# Patient Record
Sex: Female | Born: 1935 | Race: White | Hispanic: No | Marital: Married | Smoking: Never smoker
Health system: Southern US, Community
[De-identification: ages and names within clinical notes are randomized; demographics above are authoritative.]

## PROBLEM LIST (undated history)

## (undated) DIAGNOSIS — C44300 Unspecified malignant neoplasm of skin of unspecified part of face: Secondary | ICD-10-CM

## (undated) DIAGNOSIS — E039 Hypothyroidism, unspecified: Secondary | ICD-10-CM

## (undated) DIAGNOSIS — E0590 Thyrotoxicosis, unspecified without thyrotoxic crisis or storm: Secondary | ICD-10-CM

## (undated) DIAGNOSIS — N200 Calculus of kidney: Secondary | ICD-10-CM

## (undated) DIAGNOSIS — M51369 Other intervertebral disc degeneration, lumbar region without mention of lumbar back pain or lower extremity pain: Secondary | ICD-10-CM

## (undated) DIAGNOSIS — E1129 Type 2 diabetes mellitus with other diabetic kidney complication: Secondary | ICD-10-CM

## (undated) DIAGNOSIS — Z973 Presence of spectacles and contact lenses: Secondary | ICD-10-CM

## (undated) DIAGNOSIS — J309 Allergic rhinitis, unspecified: Secondary | ICD-10-CM

## (undated) DIAGNOSIS — Z8639 Personal history of other endocrine, nutritional and metabolic disease: Secondary | ICD-10-CM

## (undated) DIAGNOSIS — G20A1 Parkinson's disease without dyskinesia, without mention of fluctuations: Secondary | ICD-10-CM

## (undated) DIAGNOSIS — N049 Nephrotic syndrome with unspecified morphologic changes: Secondary | ICD-10-CM

## (undated) DIAGNOSIS — M1990 Unspecified osteoarthritis, unspecified site: Secondary | ICD-10-CM

## (undated) DIAGNOSIS — I10 Essential (primary) hypertension: Secondary | ICD-10-CM

## (undated) DIAGNOSIS — I272 Pulmonary hypertension, unspecified: Secondary | ICD-10-CM

## (undated) DIAGNOSIS — I509 Heart failure, unspecified: Secondary | ICD-10-CM

## (undated) DIAGNOSIS — I5189 Other ill-defined heart diseases: Secondary | ICD-10-CM

## (undated) DIAGNOSIS — N184 Chronic kidney disease, stage 4 (severe): Secondary | ICD-10-CM

## (undated) DIAGNOSIS — J45909 Unspecified asthma, uncomplicated: Secondary | ICD-10-CM

## (undated) DIAGNOSIS — E119 Type 2 diabetes mellitus without complications: Secondary | ICD-10-CM

## (undated) DIAGNOSIS — E785 Hyperlipidemia, unspecified: Secondary | ICD-10-CM

## (undated) DIAGNOSIS — Z9981 Dependence on supplemental oxygen: Secondary | ICD-10-CM

## (undated) DIAGNOSIS — M5136 Other intervertebral disc degeneration, lumbar region: Secondary | ICD-10-CM

## (undated) DIAGNOSIS — K219 Gastro-esophageal reflux disease without esophagitis: Secondary | ICD-10-CM

## (undated) DIAGNOSIS — G20 Parkinson's disease: Secondary | ICD-10-CM

## (undated) DIAGNOSIS — H1010 Acute atopic conjunctivitis, unspecified eye: Secondary | ICD-10-CM

## (undated) HISTORY — DX: Allergic rhinitis, unspecified: J30.9

## (undated) HISTORY — DX: Essential (primary) hypertension: I10

## (undated) HISTORY — PX: SKIN CANCER EXCISION: SHX779

## (undated) HISTORY — DX: Other intervertebral disc degeneration, lumbar region without mention of lumbar back pain or lower extremity pain: M51.369

## (undated) HISTORY — DX: Nephrotic syndrome with unspecified morphologic changes: N04.9

## (undated) HISTORY — DX: Gastro-esophageal reflux disease without esophagitis: K21.9

## (undated) HISTORY — PX: CATARACT EXTRACTION W/ INTRAOCULAR LENS  IMPLANT, BILATERAL: SHX1307

## (undated) HISTORY — DX: Hyperlipidemia, unspecified: E78.5

## (undated) HISTORY — DX: Parkinson's disease: G20

## (undated) HISTORY — PX: BREAST BIOPSY: SHX20

## (undated) HISTORY — DX: Parkinson's disease without dyskinesia, without mention of fluctuations: G20.A1

## (undated) HISTORY — DX: Other ill-defined heart diseases: I51.89

## (undated) HISTORY — DX: Type 2 diabetes mellitus without complications: E11.9

## (undated) HISTORY — PX: ABDOMINAL HYSTERECTOMY: SHX81

## (undated) HISTORY — PX: LUMBAR LAMINECTOMY: SHX95

## (undated) HISTORY — DX: Acute atopic conjunctivitis, unspecified eye: H10.10

## (undated) HISTORY — PX: BACK SURGERY: SHX140

## (undated) HISTORY — PX: LAPAROSCOPIC CHOLECYSTECTOMY: SUR755

## (undated) HISTORY — DX: Unspecified asthma, uncomplicated: J45.909

## (undated) HISTORY — DX: Type 2 diabetes mellitus with other diabetic kidney complication: E11.29

## (undated) HISTORY — PX: VESICOVAGINAL FISTULA CLOSURE W/ TAH: SUR271

## (undated) HISTORY — DX: Other intervertebral disc degeneration, lumbar region: M51.36

## (undated) HISTORY — DX: Pulmonary hypertension, unspecified: I27.20

## (undated) HISTORY — DX: Personal history of other endocrine, nutritional and metabolic disease: Z86.39

## (undated) HISTORY — PX: APPENDECTOMY: SHX54

---
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
## 2012-07-12 DIAGNOSIS — M5414 Radiculopathy, thoracic region: Secondary | ICD-10-CM | POA: Insufficient documentation

## 2013-01-09 ENCOUNTER — Other Ambulatory Visit (HOSPITAL_COMMUNITY): Payer: Self-pay | Admitting: *Deleted

## 2013-01-12 ENCOUNTER — Encounter (HOSPITAL_COMMUNITY): Payer: Self-pay

## 2013-04-20 DIAGNOSIS — M5137 Other intervertebral disc degeneration, lumbosacral region: Secondary | ICD-10-CM | POA: Insufficient documentation

## 2013-04-20 DIAGNOSIS — M961 Postlaminectomy syndrome, not elsewhere classified: Secondary | ICD-10-CM | POA: Insufficient documentation

## 2013-04-20 DIAGNOSIS — M533 Sacrococcygeal disorders, not elsewhere classified: Secondary | ICD-10-CM | POA: Insufficient documentation

## 2013-06-21 DIAGNOSIS — G20C Parkinsonism, unspecified: Secondary | ICD-10-CM | POA: Insufficient documentation

## 2013-06-21 DIAGNOSIS — G64 Other disorders of peripheral nervous system: Secondary | ICD-10-CM | POA: Insufficient documentation

## 2013-06-21 DIAGNOSIS — E1142 Type 2 diabetes mellitus with diabetic polyneuropathy: Secondary | ICD-10-CM | POA: Insufficient documentation

## 2013-06-21 DIAGNOSIS — I679 Cerebrovascular disease, unspecified: Secondary | ICD-10-CM | POA: Insufficient documentation

## 2013-06-21 DIAGNOSIS — G20 Parkinson's disease: Secondary | ICD-10-CM | POA: Insufficient documentation

## 2013-06-21 DIAGNOSIS — M48061 Spinal stenosis, lumbar region without neurogenic claudication: Secondary | ICD-10-CM | POA: Insufficient documentation

## 2013-08-31 ENCOUNTER — Encounter: Payer: Self-pay | Admitting: Critical Care Medicine

## 2013-09-01 ENCOUNTER — Encounter: Payer: Self-pay | Admitting: Critical Care Medicine

## 2013-09-01 ENCOUNTER — Ambulatory Visit (INDEPENDENT_AMBULATORY_CARE_PROVIDER_SITE_OTHER): Payer: Self-pay | Admitting: Critical Care Medicine

## 2013-09-01 VITALS — BP 160/70 | HR 83 | Temp 97.1°F | Ht 60.0 in | Wt 196.0 lb

## 2013-09-01 DIAGNOSIS — J309 Allergic rhinitis, unspecified: Secondary | ICD-10-CM

## 2013-09-01 DIAGNOSIS — N189 Chronic kidney disease, unspecified: Secondary | ICD-10-CM | POA: Insufficient documentation

## 2013-09-01 DIAGNOSIS — J45901 Unspecified asthma with (acute) exacerbation: Secondary | ICD-10-CM

## 2013-09-01 DIAGNOSIS — R0600 Dyspnea, unspecified: Secondary | ICD-10-CM

## 2013-09-01 DIAGNOSIS — N049 Nephrotic syndrome with unspecified morphologic changes: Secondary | ICD-10-CM | POA: Insufficient documentation

## 2013-09-01 DIAGNOSIS — J4540 Moderate persistent asthma, uncomplicated: Secondary | ICD-10-CM | POA: Insufficient documentation

## 2013-09-01 DIAGNOSIS — J4541 Moderate persistent asthma with (acute) exacerbation: Secondary | ICD-10-CM

## 2013-09-01 DIAGNOSIS — K219 Gastro-esophageal reflux disease without esophagitis: Secondary | ICD-10-CM | POA: Insufficient documentation

## 2013-09-01 DIAGNOSIS — E785 Hyperlipidemia, unspecified: Secondary | ICD-10-CM | POA: Insufficient documentation

## 2013-09-01 DIAGNOSIS — E1129 Type 2 diabetes mellitus with other diabetic kidney complication: Secondary | ICD-10-CM | POA: Insufficient documentation

## 2013-09-01 DIAGNOSIS — H1010 Acute atopic conjunctivitis, unspecified eye: Secondary | ICD-10-CM | POA: Insufficient documentation

## 2013-09-01 DIAGNOSIS — I5189 Other ill-defined heart diseases: Secondary | ICD-10-CM | POA: Insufficient documentation

## 2013-09-01 DIAGNOSIS — I10 Essential (primary) hypertension: Secondary | ICD-10-CM | POA: Insufficient documentation

## 2013-09-01 DIAGNOSIS — G20 Parkinson's disease: Secondary | ICD-10-CM | POA: Insufficient documentation

## 2013-09-01 DIAGNOSIS — E1142 Type 2 diabetes mellitus with diabetic polyneuropathy: Secondary | ICD-10-CM | POA: Insufficient documentation

## 2013-09-01 MED ORDER — FLUTICASONE FUROATE-VILANTEROL 200-25 MCG/INH IN AEPB: 1.0000 | INHALATION_SPRAY | Freq: Every day | RESPIRATORY_TRACT | Status: DC

## 2013-09-01 NOTE — Patient Instructions (Signed)
We will schedule you to have a 2D echo at [facility] A breathing test and six minute walk test will be obtained at [facility] We will test your oxygen levels at bedtime  Stop Pulmicort Start Breo 200 1 puff daily Follow up with Dr. Joya Gaskins in 1 month in [place]

## 2013-09-01 NOTE — Assessment & Plan Note (Addendum)
Moderate persistent asthma with significant lower airway inflammation Plan Stop Pulmicort Start Breo 200 1 puff daily Obtain pulmonary function studies Obtain echogram Obtain 6 minute walk

## 2013-09-01 NOTE — Progress Notes (Signed)
Subjective:    Patient ID: Katelyn Dunlap, female    DOB: [birth year], 80 y.o.   MRN: [id number]  HPI Comments: Cough and wheeze for 35months, green mucus. Life long never smoker .  Dx asthma: 41yrs Hx of ? pulm HTN  No echo??  Shortness of Breath This is a chronic problem. The current episode started more than 1 year ago. The problem occurs constantly (worse with any activity). The problem has been rapidly worsening. Associated symptoms include chest pain, leg swelling, orthopnea, PND, a sore throat, sputum production and wheezing. Pertinent negatives include no abdominal pain, headaches, hemoptysis, rhinorrhea or vomiting. The symptoms are aggravated by weather changes, odors, fumes, smoke, lying flat and any activity. Associated symptoms comments: No pndrip occ gerd. She has tried beta agonist inhalers for the symptoms. The treatment provided moderate relief. Her past medical history is significant for allergies and asthma. There is no history of bronchiolitis, CAD, chronic lung disease, COPD, DVT, a heart failure, PE, pneumonia or a recent surgery. (Had allergy injections in the past. )   Past Medical History  Diagnosis Date  . Asthma   . Allergic rhinoconjunctivitis   . Pulmonary hypertension   . Diastolic dysfunction   . GERD (gastroesophageal reflux disease)   . Hypertension   . Diabetes   . Hyperlipemia   . Degenerative lumbar disc   . Parkinson's disease   . H/O Graves' disease   . Chronic kidney disease   . Nephrotic syndrome   . DM (diabetes mellitus), type 2 with renal complications      Family History  Problem Relation Age of Onset  . Heart disease Father      History   Social History  . Marital Status: N/A    Spouse Name: N/A    Number of Children: N/A  . Years of Education: N/A   Occupational History  . retired     [employer]   Social History Main Topics  . Smoking status: Never Smoker   . Smokeless tobacco: Never Used  . Alcohol Use: No  .  Drug Use: No  . Sexual Activity: Not on file   Other Topics Concern  . Not on file   Social History Narrative  . No narrative on file     Allergies  Allergen Reactions  . Ace Inhibitors Cough    Cough  . Amlodipine     swelling  . Nitrofurantoin     rash  . Penicillins     rash  . Pentazocine     [place] - insomnia  . Pentazocine-Naloxone     Other reaction(s): ABDOMINAL PAIN  . Pentobarbital   . Streptomycin     Rash, insomnia  . Sulfa Antibiotics     rash     Outpatient Prescriptions Prior to Visit  Medication Sig Dispense Refill  . albuterol (PROVENTIL HFA;VENTOLIN HFA) 108 (90 BASE) MCG/ACT inhaler Inhale 1-2 puffs into the lungs every 6 (six) hours as needed for wheezing or shortness of breath.      . budesonide (PULMICORT) 180 MCG/ACT inhaler Inhale 3 puffs into the lungs 2 (two) times daily.      . cetirizine (ZYRTEC) 10 MG tablet Take 10 mg by mouth daily.      . Furosemide (LASIX PO) Take 60 mg by mouth 2 (two) times daily.      Marland Kitchen labetalol (NORMODYNE) 300 MG tablet Take 300 mg by mouth 2 (two) times daily.      Marland Kitchen omeprazole (PRILOSEC)  20 MG capsule Take 20 mg by mouth daily.      . hydrALAZINE (APRESOLINE) 100 MG tablet Take 100 mg by mouth 3 (three) times daily.      . mometasone (NASONEX) 50 MCG/ACT nasal spray Place 1 spray into the nose daily.       No facility-administered medications prior to visit.       Review of Systems  HENT: Positive for sore throat. Negative for rhinorrhea, trouble swallowing and voice change.   Respiratory: Positive for cough, sputum production, chest tightness, shortness of breath and wheezing. Negative for hemoptysis.   Cardiovascular: Positive for chest pain, orthopnea, leg swelling and PND.  Gastrointestinal: Negative for vomiting and abdominal pain.  Neurological: Negative for headaches.       Objective:   Physical Exam Filed Vitals:   09/01/13 1209  BP: 160/70  Pulse: 83  Temp: 97.1 F (36.2 C)  TempSrc:  Oral  Height: 5' (1.524 m)  Weight: 196 lb (88.905 kg)  SpO2: 94%    Gen: Pleasant, well-nourished, in no distress,  normal affect  ENT: No lesions,  mouth clear,  oropharynx clear, no postnasal drip  Neck: No JVD, no TMG, no carotid bruits  Lungs: No use of accessory muscles, no dullness to percussion, distant breath sound  Cardiovascular: RRR, heart sounds normal, no murmur or gallops, no peripheral edema  Abdomen: soft and NT, no HSM,  BS normal  Musculoskeletal: No deformities, no cyanosis or clubbing  Neuro: alert, non focal  Skin: Warm, no lesions or rashes  No results found.   Chest x-ray was reviewed     Assessment & Plan:   Asthma, moderate persistent Moderate persistent asthma with significant lower airway inflammation Plan Stop Pulmicort Start Breo 200 1 puff daily Obtain pulmonary function studies Obtain echogram Obtain 6 minute walk    Updated Medication List Outpatient Encounter Prescriptions as of 09/01/2013  Medication Sig  . acetaminophen (TYLENOL) 650 MG CR tablet Take 650 mg by mouth every 8 (eight) hours as needed for pain.  Marland Kitchen albuterol (PROVENTIL HFA;VENTOLIN HFA) 108 (90 BASE) MCG/ACT inhaler Inhale 1-2 puffs into the lungs every 6 (six) hours as needed for wheezing or shortness of breath.  Marland Kitchen aspirin 81 MG tablet Take 81 mg by mouth daily.  Marland Kitchen atorvastatin (LIPITOR) 80 MG tablet Take 80 mg by mouth daily.  . carbidopa-levodopa (SINEMET IR) 25-100 MG per tablet Take 1 tablet by mouth 3 (three) times daily.  . cetirizine (ZYRTEC) 10 MG tablet Take 10 mg by mouth daily.  . cloNIDine (CATAPRES) 0.1 MG tablet Take 0.1 mg by mouth at bedtime.  . EPOETIN ALFA IJ Inject as directed once a week.  Marland Kitchen Fe-Succ-C-Thre-B12-Des Stomach (MULTIGEN PO) Take by mouth daily.  . ferrous sulfate 325 (65 FE) MG tablet Take 325 mg by mouth every other day.  . folic acid (FOLVITE) 818 MCG tablet Take 400 mcg by mouth daily.  . Furosemide (LASIX PO) Take 60 mg by  mouth 2 (two) times daily.  Marland Kitchen gabapentin (NEURONTIN) 300 MG capsule Take 300 mg by mouth 3 (three) times daily.  . Glucosamine-Chondroitin 250-200 MG CAPS Take by mouth 2 (two) times daily.  . insulin aspart (NOVOLOG FLEXPEN) 100 UNIT/ML FlexPen Inject 22 Units into the skin 3 (three) times daily with meals.  . Insulin Detemir (LEVEMIR FLEXPEN) 100 UNIT/ML Pen Inject 45 Units into the skin at bedtime.  . isosorbide mononitrate (IMDUR) 120 MG 24 hr tablet Take 120 mg by mouth daily.  Marland Kitchen  labetalol (NORMODYNE) 300 MG tablet Take 300 mg by mouth 2 (two) times daily.  Marland Kitchen levothyroxine (SYNTHROID, LEVOTHROID) 200 MCG tablet Take 200 mcg by mouth daily before breakfast.  . Methylcellulose, Laxative, (FIBER THERAPY PO) Take by mouth 2 (two) times daily.  . metolazone (ZAROXOLYN) 5 MG tablet Take 5 mg by mouth every other day.  . Multiple Vitamin (MULTIVITAMIN) tablet Take 1 tablet by mouth daily.  . multivitamin-lutein (OCUVITE-LUTEIN) CAPS capsule Take 1 capsule by mouth daily.  Marland Kitchen omeprazole (PRILOSEC) 20 MG capsule Take 20 mg by mouth daily.  Marland Kitchen oxycodone (OXY-IR) 5 MG capsule Take 2.5-5 mg by mouth 2 (two) times daily as needed.  Marland Kitchen rOPINIRole (REQUIP) 2 MG tablet Take 2 mg by mouth 3 (three) times daily.  . valsartan (DIOVAN) 160 MG tablet Take 160 mg by mouth daily.  . [DISCONTINUED] budesonide (PULMICORT) 180 MCG/ACT inhaler Inhale 3 puffs into the lungs 2 (two) times daily.  . cyclobenzaprine (FLEXERIL) 5 MG tablet Take 10 mg by mouth 2 (two) times daily.  . Fluticasone Furoate-Vilanterol (BREO ELLIPTA) 200-25 MCG/INH AEPB Inhale 1 puff into the lungs daily.  . hydrALAZINE (APRESOLINE) 100 MG tablet Take 100 mg by mouth 3 (three) times daily.  . mometasone (NASONEX) 50 MCG/ACT nasal spray Place 1 spray into the nose daily.  . [DISCONTINUED] levothyroxine (SYNTHROID, LEVOTHROID) 175 MCG tablet Take 175 mcg by mouth daily before breakfast.

## 2013-09-04 LAB — PULMONARY FUNCTION TEST

## 2013-09-07 ENCOUNTER — Telehealth: Payer: Self-pay | Admitting: Critical Care Medicine

## 2013-09-07 DIAGNOSIS — J4541 Moderate persistent asthma with (acute) exacerbation: Secondary | ICD-10-CM

## 2013-09-07 NOTE — Telephone Encounter (Signed)
ONO on RA positive desaturation Pt needs oxygen 2L qhs  Use [facility]

## 2013-09-07 NOTE — Telephone Encounter (Signed)
[facility] pt - line rang several times.  NA and no option to leave msg.  WCB

## 2013-09-10 NOTE — Telephone Encounter (Signed)
Called, spoke with pt. Informed her of below results and recs per Dr. Joya Gaskins. She verbalized understanding and is aware [facility] will be contacting her to set o2 up. She voiced no further questions or concerns at this time and is to call office back if [facility] doesn't contact her within the next few days. Will send to [facility] - pls ensure order is sent.   Thank you.

## 2013-09-10 NOTE — Telephone Encounter (Signed)
Called and spoke with pt and she is aware of the following results per PW:  Echo was normal PFT showed moderate obstruction w/ sig. Response to bronchodilators And that at her next OV we will walk her in the office.    Pt voiced her understanding and nothing further was needed.  Looks like Katelyn Dunlap faxed the order to [facility] in [place] today.  Will sign off of the message.

## 2013-09-30 ENCOUNTER — Encounter: Payer: Self-pay | Admitting: Critical Care Medicine

## 2013-10-06 ENCOUNTER — Encounter: Payer: Self-pay | Admitting: Critical Care Medicine

## 2013-10-06 ENCOUNTER — Ambulatory Visit (INDEPENDENT_AMBULATORY_CARE_PROVIDER_SITE_OTHER): Payer: Self-pay | Admitting: Critical Care Medicine

## 2013-10-06 VITALS — BP 178/70 | HR 92 | Temp 96.9°F | Ht 60.0 in | Wt 197.0 lb

## 2013-10-06 DIAGNOSIS — J4540 Moderate persistent asthma, uncomplicated: Secondary | ICD-10-CM

## 2013-10-06 DIAGNOSIS — I5189 Other ill-defined heart diseases: Secondary | ICD-10-CM

## 2013-10-06 DIAGNOSIS — J45909 Unspecified asthma, uncomplicated: Secondary | ICD-10-CM

## 2013-10-06 MED ORDER — ALBUTEROL SULFATE HFA 108 (90 BASE) MCG/ACT IN AERS
1.0000 | INHALATION_SPRAY | Freq: Four times a day (QID) | RESPIRATORY_TRACT | Status: DC | PRN
Start: 2013-10-06 — End: 2014-03-09

## 2013-10-06 NOTE — Progress Notes (Signed)
Subjective:    Patient ID: Fartun Paradiso, female    DOB: [birth year], 80 y.o.   MRN: [id number]  HPI 10/06/2013 Chief Complaint  Patient presents with  . Follow-up    SOB is unchanged.   No changes in dyspnea.  No cough, no wheeze. Pt denies any significant sore throat, nasal congestion or excess secretions, fever, chills, sweats, unintended weight loss, pleurtic or exertional chest pain, orthopnea PND, or leg swelling Pt denies any increase in rescue therapy over baseline, denies waking up needing it or having any early am or nocturnal exacerbations of coughing/wheezing/or dyspnea. Pt also denies any obvious fluctuation in symptoms with  weather or environmental change or other alleviating or aggravating factors      Review of Systems Constitutional:   No  weight loss, night sweats,  Fevers, chills, fatigue, lassitude. HEENT:   No headaches,  Difficulty swallowing,  Tooth/dental problems,  Sore throat,                No sneezing, itching, ear ache, nasal congestion, post nasal drip,   CV:  No chest pain,  Orthopnea, PND, swelling in lower extremities, anasarca, dizziness, palpitations  GI  No heartburn, indigestion, abdominal pain, nausea, vomiting, diarrhea, change in bowel habits, loss of appetite  Resp: Notes shortness of breath with exertion not at rest.  No excess mucus, no productive cough,  No non-productive cough,  No coughing up of blood.  No change in color of mucus.  No wheezing.  No chest wall deformity  Skin: no rash or lesions.  GU: no dysuria, change in color of urine, no urgency or frequency.  No flank pain.  MS:  No joint pain or swelling.  No decreased range of motion.  No back pain.  Psych:  No change in mood or affect. No depression or anxiety.  No memory loss.     Objective:   Physical Exam Filed Vitals:   10/06/13 1122  BP: 178/70  Pulse: 92  Temp: 96.9 F (36.1 C)  TempSrc: Oral  Height: 5' (1.524 m)  Weight: 197 lb (89.359 kg)  SpO2: 96%     Gen: Pleasant, well-nourished, in no distress,  normal affect  ENT: No lesions,  mouth clear,  oropharynx clear, no postnasal drip  Neck: No JVD, no TMG, no carotid bruits  Lungs: No use of accessory muscles, no dullness to percussion, distant BS  Cardiovascular: RRR, heart sounds normal, no murmur or gallops, no peripheral edema  Abdomen: soft and NT, no HSM,  BS normal  Musculoskeletal: No deformities, no cyanosis or clubbing  Neuro: alert, non focal  Skin: Warm, no lesions or rashes  No results found.  Arlyce Harman 7/31: moderate obstruction with reversable component.  FeV1 77%  FVC 82%  Fev1/FVC 68%    Echo Ef 96% diastolic dysfunction mild      Assessment & Plan:   Asthma, moderate persistent Moderate persistent asthma stable at present Plan Maintain breo Prn SABA Oxygen 2L qhs    Updated Medication List Outpatient Encounter Prescriptions as of 10/06/2013  Medication Sig  . acetaminophen (TYLENOL) 650 MG CR tablet Take 650 mg by mouth every 8 (eight) hours as needed for pain.  Marland Kitchen albuterol (PROVENTIL HFA;VENTOLIN HFA) 108 (90 BASE) MCG/ACT inhaler Inhale 1-2 puffs into the lungs every 6 (six) hours as needed for wheezing or shortness of breath.  Marland Kitchen aspirin 81 MG tablet Take 81 mg by mouth daily.  Marland Kitchen atorvastatin (LIPITOR) 80 MG tablet Take 80 mg by mouth  daily.  . carbidopa-levodopa (SINEMET IR) 25-100 MG per tablet Take 1 tablet by mouth 3 (three) times daily.  . cetirizine (ZYRTEC) 10 MG tablet Take 10 mg by mouth daily.  . cloNIDine (CATAPRES) 0.1 MG tablet Take 0.1 mg by mouth at bedtime.  . cyclobenzaprine (FLEXERIL) 5 MG tablet Take 10 mg by mouth 2 (two) times daily.  . EPOETIN ALFA IJ Inject as directed once a week.  Marland Kitchen Fe-Succ-C-Thre-B12-Des Stomach (MULTIGEN PO) Take by mouth daily.  . ferrous sulfate 325 (65 FE) MG tablet Take 325 mg by mouth every other day.  . Fluticasone Furoate-Vilanterol (BREO ELLIPTA) 200-25 MCG/INH AEPB Inhale 1 puff into the lungs  daily.  . folic acid (FOLVITE) 010 MCG tablet Take 400 mcg by mouth daily.  . Furosemide (LASIX PO) Take 60 mg by mouth 2 (two) times daily.  Marland Kitchen gabapentin (NEURONTIN) 300 MG capsule Take 300 mg by mouth 3 (three) times daily.  . Glucosamine-Chondroitin 250-200 MG CAPS Take by mouth 2 (two) times daily.  . hydrALAZINE (APRESOLINE) 100 MG tablet Take 100 mg by mouth 3 (three) times daily.  . insulin aspart (NOVOLOG FLEXPEN) 100 UNIT/ML FlexPen Inject 22 Units into the skin 3 (three) times daily with meals.  . Insulin Detemir (LEVEMIR FLEXPEN) 100 UNIT/ML Pen Inject 45 Units into the skin at bedtime.  . isosorbide mononitrate (IMDUR) 120 MG 24 hr tablet Take 120 mg by mouth daily.  Marland Kitchen labetalol (NORMODYNE) 300 MG tablet Take 300 mg by mouth 2 (two) times daily.  Marland Kitchen levothyroxine (SYNTHROID, LEVOTHROID) 200 MCG tablet Take 200 mcg by mouth daily before breakfast.  . Methylcellulose, Laxative, (FIBER THERAPY PO) Take by mouth 2 (two) times daily.  . metolazone (ZAROXOLYN) 5 MG tablet Take 5 mg by mouth every other day.  . mometasone (NASONEX) 50 MCG/ACT nasal spray Place 1 spray into the nose daily.  . Multiple Vitamin (MULTIVITAMIN) tablet Take 1 tablet by mouth daily.  . multivitamin-lutein (OCUVITE-LUTEIN) CAPS capsule Take 1 capsule by mouth daily.  Marland Kitchen omeprazole (PRILOSEC) 20 MG capsule Take 20 mg by mouth daily.  Marland Kitchen oxycodone (OXY-IR) 5 MG capsule Take 2.5-5 mg by mouth 2 (two) times daily as needed.  Marland Kitchen rOPINIRole (REQUIP) 2 MG tablet Take 2 mg by mouth 3 (three) times daily.  . valsartan (DIOVAN) 160 MG tablet Take 160 mg by mouth daily.  . [DISCONTINUED] albuterol (PROVENTIL HFA;VENTOLIN HFA) 108 (90 BASE) MCG/ACT inhaler Inhale 1-2 puffs into the lungs every 6 (six) hours as needed for wheezing or shortness of breath.

## 2013-10-06 NOTE — Patient Instructions (Signed)
Stay on oxygen  2 liters at bedtime Stay on Breo one puff daily No other medication changes Refill on albuterol sent to pharmacy Return 4 months

## 2013-10-06 NOTE — Assessment & Plan Note (Signed)
Moderate persistent asthma stable at present Plan Maintain breo Prn SABA Oxygen 2L qhs

## 2013-10-07 ENCOUNTER — Encounter: Payer: Self-pay | Admitting: Critical Care Medicine

## 2013-11-04 ENCOUNTER — Encounter: Payer: Self-pay | Admitting: Critical Care Medicine

## 2014-03-09 ENCOUNTER — Encounter: Payer: Self-pay | Admitting: Critical Care Medicine

## 2014-03-09 ENCOUNTER — Ambulatory Visit (INDEPENDENT_AMBULATORY_CARE_PROVIDER_SITE_OTHER): Payer: Self-pay | Admitting: Critical Care Medicine

## 2014-03-09 VITALS — BP 152/66 | HR 94 | Temp 96.8°F | Ht 60.0 in | Wt 202.0 lb

## 2014-03-09 DIAGNOSIS — J449 Chronic obstructive pulmonary disease, unspecified: Secondary | ICD-10-CM

## 2014-03-09 DIAGNOSIS — J4540 Moderate persistent asthma, uncomplicated: Secondary | ICD-10-CM

## 2014-03-09 MED ORDER — FLUTICASONE FUROATE-VILANTEROL 200-25 MCG/INH IN AEPB: 1.0000 | INHALATION_SPRAY | Freq: Every day | RESPIRATORY_TRACT | Status: DC

## 2014-03-09 MED ORDER — ALBUTEROL SULFATE HFA 108 (90 BASE) MCG/ACT IN AERS
1.0000 | INHALATION_SPRAY | Freq: Four times a day (QID) | RESPIRATORY_TRACT | Status: AC | PRN
Start: 2014-03-09 — End: ?

## 2014-03-09 NOTE — Patient Instructions (Signed)
Stop nasonex and zyrtec  Stay on Breo one puff daily Be careful with falls Stay on oxygen 2L at night Return 4 month s

## 2014-03-09 NOTE — Progress Notes (Signed)
Subjective:    Patient ID: Katelyn Dunlap, female    DOB: [birth year], 81 y.o.   MRN: [id number]  HPI 03/09/2014 Chief Complaint  Patient presents with  . 4 month follow up    SOB unchanged.  Cough with green mucus x 2 - 3 wks.  No f/c/s.  Pt notes dyspnea and cough is unchanged.  Pt had URI just after Holidays.  Now is better but still is coughing green mucus. Pt is s/p ABX/Steroids .  ABX affected muscle strength. Pt having low BS at times. Pt denies any significant sore throat, nasal congestion or excess secretions, fever, chills, sweats, unintended weight loss, pleurtic or exertional chest pain, orthopnea PND, or leg swelling Pt denies any increase in rescue therapy over baseline, denies waking up needing it or having any early am or nocturnal exacerbations of coughing/wheezing/or dyspnea. Pt also denies any obvious fluctuation in symptoms with  weather or environmental change or other alleviating or aggravating factors  Review of Systems Constitutional:   No  weight loss, night sweats,  Fevers, chills, fatigue, lassitude. HEENT:   No headaches,  Difficulty swallowing,  Tooth/dental problems,  Sore throat,                No sneezing, itching, ear ache, nasal congestion, post nasal drip,   CV:  No chest pain,  Orthopnea, PND, swelling in lower extremities, anasarca, dizziness, palpitations  GI  No heartburn, indigestion, abdominal pain, nausea, vomiting, diarrhea, change in bowel habits, loss of appetite  Resp: Notes shortness of breath with exertion not at rest.  No excess mucus, no productive cough,  No non-productive cough,  No coughing up of blood.  No change in color of mucus.  No wheezing.  No chest wall deformity  Skin: no rash or lesions.  GU: no dysuria, change in color of urine, no urgency or frequency.  No flank pain.  MS:  No joint pain or swelling.  No decreased range of motion.  No back pain.  Psych:  No change in mood or affect. No depression or anxiety.  No memory  loss.     Objective:   Physical Exam Filed Vitals:   03/09/14 1500  BP: 152/66  Pulse: 94  Temp: 96.8 F (36 C)  TempSrc: Oral  Height: 5' (1.524 m)  Weight: 202 lb (91.627 kg)  SpO2: 95%    Gen: Pleasant, well-nourished, in no distress,  normal affect  ENT: No lesions,  mouth clear,  oropharynx clear, no postnasal drip  Neck: No JVD, no TMG, no carotid bruits  Lungs: No use of accessory muscles, no dullness to percussion, distant BS  Cardiovascular: RRR, heart sounds normal, no murmur or gallops, no peripheral edema  Abdomen: soft and NT, no HSM,  BS normal  Musculoskeletal: No deformities, no cyanosis or clubbing  Neuro: alert, non focal  Skin: Warm, no lesions or rashes  No results found.      Assessment & Plan:   Asthma, moderate persistent Moderate persistent asthma with lower airway obstruction and reversible component Nocturnal hypoxemia Plan top nasonex and zyrtec  Stay on Breo one puff daily Be careful with falls Stay on oxygen 2L at night Return 4 month s      Updated Medication List Outpatient Encounter Prescriptions as of 03/09/2014  Medication Sig  . acetaminophen (TYLENOL) 650 MG CR tablet Take 650 mg by mouth every 8 (eight) hours as needed for pain.  Marland Kitchen albuterol (PROVENTIL HFA;VENTOLIN HFA) 108 (90 BASE) MCG/ACT inhaler Inhale  1-2 puffs into the lungs every 6 (six) hours as needed for wheezing or shortness of breath.  Marland Kitchen aspirin 81 MG tablet Take 81 mg by mouth daily.  Marland Kitchen atorvastatin (LIPITOR) 80 MG tablet Take 80 mg by mouth daily.  . calcitRIOL (ROCALTROL) 0.25 MCG capsule Take 1 capsule by mouth. Three times weekly  . carbidopa-levodopa (SINEMET IR) 25-100 MG per tablet Take 1 tablet by mouth 3 (three) times daily.  . cloNIDine (CATAPRES) 0.1 MG tablet Take 0.1 mg by mouth at bedtime.  . cyclobenzaprine (FLEXERIL) 5 MG tablet Take 10 mg by mouth 2 (two) times daily.  . EPOETIN ALFA IJ Inject as directed every 14 (fourteen) days.   Marland Kitchen  Fe-Succ-C-Thre-B12-Des Stomach (MULTIGEN PO) Take by mouth daily.  . ferrous sulfate 325 (65 FE) MG tablet Take 325 mg by mouth every other day.  . Fluticasone Furoate-Vilanterol (BREO ELLIPTA) 200-25 MCG/INH AEPB Inhale 1 puff into the lungs daily.  . folic acid (FOLVITE) 270 MCG tablet Take 400 mcg by mouth daily.  . Furosemide (LASIX PO) Take 60 mg by mouth daily. And second dose if needed  . gabapentin (NEURONTIN) 300 MG capsule Take 300 mg by mouth 3 (three) times daily.  . Glucosamine-Chondroitin 250-200 MG CAPS Take by mouth 2 (two) times daily.  . hydrALAZINE (APRESOLINE) 100 MG tablet Take 100 mg by mouth 3 (three) times daily.  Marland Kitchen HYDROcodone-acetaminophen (NORCO/VICODIN) 5-325 MG per tablet Take 1 tablet by mouth 2 (two) times daily as needed.  . insulin aspart (NOVOLOG FLEXPEN) 100 UNIT/ML FlexPen Inject 24 Units into the skin 3 (three) times daily with meals.   . Insulin Detemir (LEVEMIR FLEXPEN) 100 UNIT/ML Pen Inject 45 Units into the skin at bedtime.  . isosorbide mononitrate (IMDUR) 120 MG 24 hr tablet Take 120 mg by mouth daily.  Marland Kitchen labetalol (NORMODYNE) 300 MG tablet Take 300 mg by mouth 2 (two) times daily.  Marland Kitchen levothyroxine (SYNTHROID, LEVOTHROID) 200 MCG tablet Take 200 mcg by mouth daily before breakfast.  . Methylcellulose, Laxative, (FIBER THERAPY PO) Take by mouth 2 (two) times daily.  . metolazone (ZAROXOLYN) 5 MG tablet Take 5 mg by mouth every other day.  . Multiple Vitamin (MULTIVITAMIN) tablet Take 1 tablet by mouth daily.  . multivitamin-lutein (OCUVITE-LUTEIN) CAPS capsule Take 1 capsule by mouth daily.  Marland Kitchen omeprazole (PRILOSEC) 20 MG capsule Take 20 mg by mouth daily as needed.   Marland Kitchen rOPINIRole (REQUIP) 2 MG tablet Take 2 mg by mouth 3 (three) times daily.  . valsartan (DIOVAN) 160 MG tablet Take 160 mg by mouth daily.  . [DISCONTINUED] albuterol (PROVENTIL HFA;VENTOLIN HFA) 108 (90 BASE) MCG/ACT inhaler Inhale 1-2 puffs into the lungs every 6 (six) hours as needed  for wheezing or shortness of breath.  . [DISCONTINUED] cetirizine (ZYRTEC) 10 MG tablet Take 10 mg by mouth daily.  . [DISCONTINUED] Fluticasone Furoate-Vilanterol (BREO ELLIPTA) 200-25 MCG/INH AEPB Inhale 1 puff into the lungs daily.  . [DISCONTINUED] mometasone (NASONEX) 50 MCG/ACT nasal spray Place 1 spray into the nose daily.  . [DISCONTINUED] oxycodone (OXY-IR) 5 MG capsule Take 2.5-5 mg by mouth 2 (two) times daily as needed.

## 2014-03-09 NOTE — Assessment & Plan Note (Signed)
Moderate persistent asthma with lower airway obstruction and reversible component Nocturnal hypoxemia Plan top nasonex and zyrtec  Stay on Breo one puff daily Be careful with falls Stay on oxygen 2L at night Return 4 month s

## 2014-07-27 ENCOUNTER — Ambulatory Visit (INDEPENDENT_AMBULATORY_CARE_PROVIDER_SITE_OTHER): Payer: Self-pay | Admitting: Critical Care Medicine

## 2014-07-27 ENCOUNTER — Encounter: Payer: Self-pay | Admitting: Critical Care Medicine

## 2014-07-27 VITALS — BP 152/60 | HR 85 | Temp 97.4°F | Ht 60.0 in | Wt 201.8 lb

## 2014-07-27 DIAGNOSIS — J9611 Chronic respiratory failure with hypoxia: Secondary | ICD-10-CM

## 2014-07-27 DIAGNOSIS — J4541 Moderate persistent asthma with (acute) exacerbation: Secondary | ICD-10-CM

## 2014-07-27 DIAGNOSIS — Z9181 History of falling: Secondary | ICD-10-CM

## 2014-07-27 NOTE — Progress Notes (Signed)
   Subjective:    Patient ID: Katelyn Dunlap, female    DOB: September 14, 1931, 81 y.o.   MRN: [id number]  HPI 07/27/2014 Chief Complaint  Patient presents with  . Follow-up    sob staying the same with exertion,cough-green x 2 wks., wheezing, denies cp or tightness,no fcs   Hx of mod asthma , on breo 200.  Dyspnea is the same.  Mucus is prod x 2 weeks green in color.  Mucus is worse in the AM.  The breo has helped +++sore throat, +++nasal congestion +++excess secretions, NO  fever, chills, sweats, unintended weight loss, pleurtic or exertional chest pain, but does NOTE orthopnea  +++ leg swelling Pt notes  increase in rescue therapy over baseline, denies waking up needing it or having any early am or nocturnal exacerbations of coughing/wheezing/or dyspnea. Pt also denies any obvious fluctuation in symptoms with  weather or environmental change or other alleviating or aggravating factors   Current Medications, Allergies, Complete Past Medical History, Past Surgical History, Family History, and Social History were reviewed in [facility] record per todays encounter:  07/27/2014  Review of Systems  Constitutional: Negative.   HENT: Negative.  Negative for ear pain, postnasal drip, rhinorrhea, sinus pressure, sore throat, trouble swallowing and voice change.   Eyes: Negative.   Respiratory: Positive for cough, shortness of breath and wheezing. Negative for apnea, choking, chest tightness and stridor.   Cardiovascular: Negative.  Negative for chest pain, palpitations and leg swelling.  Gastrointestinal: Negative.  Negative for nausea, vomiting, abdominal pain and abdominal distention.  Genitourinary: Negative.   Musculoskeletal: Negative.  Negative for myalgias and arthralgias.  Skin: Negative.  Negative for rash.  Allergic/Immunologic: Negative.  Negative for environmental allergies and food allergies.  Neurological: Negative.  Negative for dizziness, syncope, weakness and  headaches.  Hematological: Negative.  Negative for adenopathy. Does not bruise/bleed easily.  Psychiatric/Behavioral: Negative.  Negative for sleep disturbance and agitation. The patient is not nervous/anxious.        Objective:   Physical Exam Filed Vitals:   07/27/14 1034  BP: 152/60  Pulse: 85  Temp: 97.4 F (36.3 C)  TempSrc: Oral  Height: 5' (1.524 m)  SpO2: 95%    Gen: Pleasant, well-nourished, in no distress,  normal affect  ENT: No lesions,  mouth clear,  oropharynx clear, no postnasal drip  Neck: No JVD, no TMG, no carotid bruits  Lungs: No use of accessory muscles, no dullness to percussion, clear without rales or rhonchi  Cardiovascular: RRR, heart sounds normal, no murmur or gallops, no peripheral edema  Abdomen: soft and NT, no HSM,  BS normal  Musculoskeletal: No deformities, no cyanosis or clubbing  Neuro: alert, non focal  Skin: Warm, no lesions or rashes  No results found.     Assessment & Plan:  I personally reviewed all images and lab data in the [facility] system as well as any outside material available during this office visit and agree with the  radiology impressions.   Asthma, moderate persistent Moderate persistent asthma stable at this time Overnight desaturation with oxygen now on 2 L at night Plan Maintain Breo daily Maintain oxygen 2 L at bedtime Return 6 months   Katelyn Dunlap was seen today for follow-up.  Diagnoses and all orders for this visit:  Asthma, moderate persistent, with acute exacerbation  At high risk for falls

## 2014-07-27 NOTE — Patient Instructions (Signed)
No change in medications. Return in        6 months        

## 2014-07-27 NOTE — Assessment & Plan Note (Signed)
Moderate persistent asthma stable at this time Overnight desaturation with oxygen now on 2 L at night Plan Maintain Breo daily Maintain oxygen 2 L at bedtime Return 6 months

## 2014-12-28 ENCOUNTER — Other Ambulatory Visit: Payer: Self-pay | Admitting: Critical Care Medicine

## 2015-01-05 ENCOUNTER — Other Ambulatory Visit: Payer: Self-pay

## 2015-01-05 MED ORDER — FLUTICASONE FUROATE-VILANTEROL 200-25 MCG/INH IN AEPB
1.0000 | INHALATION_SPRAY | Freq: Every day | RESPIRATORY_TRACT | Status: DC
Start: 2015-01-05 — End: 2015-02-28

## 2015-02-28 ENCOUNTER — Other Ambulatory Visit: Payer: Self-pay | Admitting: Adult Health

## 2015-03-25 ENCOUNTER — Telehealth: Payer: Self-pay | Admitting: Surgery

## 2015-03-25 NOTE — Telephone Encounter (Signed)
Katelyn Dunlap previously lvm on Clinical scheduling line, and it was forwarded to me to follow up. The patient stated in her message that she received paperwork for her appointment and wanted to make sure that she did not need to have an MRI outside of [place]. She asked for a CB at [phone].  I have tried to reach her by phone at the # she left, however it is a busy signal. Will try again at a later time. dpm

## 2015-03-28 ENCOUNTER — Other Ambulatory Visit: Payer: Self-pay

## 2015-03-28 DIAGNOSIS — Z01810 Encounter for preprocedural cardiovascular examination: Secondary | ICD-10-CM

## 2015-03-28 DIAGNOSIS — N184 Chronic kidney disease, stage 4 (severe): Secondary | ICD-10-CM

## 2015-03-28 NOTE — Telephone Encounter (Signed)
Attempted to reach patient again this morning. No answer as [phone]. If patient calls again, please route call to [facility] in scheduling. dpm

## 2015-04-25 ENCOUNTER — Encounter: Payer: Self-pay | Admitting: Surgery

## 2015-05-02 ENCOUNTER — Ambulatory Visit (INDEPENDENT_AMBULATORY_CARE_PROVIDER_SITE_OTHER): Payer: Medicare Other | Admitting: Surgery

## 2015-05-02 ENCOUNTER — Encounter: Payer: Self-pay | Admitting: Surgery

## 2015-05-02 ENCOUNTER — Ambulatory Visit (INDEPENDENT_AMBULATORY_CARE_PROVIDER_SITE_OTHER)
Admission: RE | Admit: 2015-05-02 | Discharge: 2015-05-02 | Disposition: A | Discharge status: Home / Self Care | Payer: Medicare Other | Source: Ambulatory Visit | Attending: Surgery | Admitting: Surgery

## 2015-05-02 ENCOUNTER — Ambulatory Visit (HOSPITAL_COMMUNITY)
Admission: RE | Admit: 2015-05-02 | Discharge: 2015-05-02 | Disposition: A | Discharge status: Home / Self Care | Payer: Medicare Other | Source: Ambulatory Visit | Attending: Surgery | Admitting: Surgery

## 2015-05-02 VITALS — BP 119/61 | HR 82 | Temp 97.6°F | Resp 18 | Ht 60.0 in | Wt 194.0 lb

## 2015-05-02 DIAGNOSIS — Z01810 Encounter for preprocedural cardiovascular examination: Secondary | ICD-10-CM | POA: Diagnosis not present

## 2015-05-02 DIAGNOSIS — K219 Gastro-esophageal reflux disease without esophagitis: Secondary | ICD-10-CM | POA: Diagnosis not present

## 2015-05-02 DIAGNOSIS — N184 Chronic kidney disease, stage 4 (severe): Secondary | ICD-10-CM

## 2015-05-02 DIAGNOSIS — I272 Other secondary pulmonary hypertension: Secondary | ICD-10-CM | POA: Diagnosis not present

## 2015-05-02 DIAGNOSIS — G20 Parkinson's disease: Secondary | ICD-10-CM | POA: Diagnosis not present

## 2015-05-02 DIAGNOSIS — E1122 Type 2 diabetes mellitus with diabetic chronic kidney disease: Secondary | ICD-10-CM | POA: Insufficient documentation

## 2015-05-02 DIAGNOSIS — E785 Hyperlipidemia, unspecified: Secondary | ICD-10-CM | POA: Insufficient documentation

## 2015-05-02 DIAGNOSIS — I129 Hypertensive chronic kidney disease with stage 1 through stage 4 chronic kidney disease, or unspecified chronic kidney disease: Secondary | ICD-10-CM | POA: Insufficient documentation

## 2015-05-02 NOTE — Progress Notes (Signed)
HISTORY AND PHYSICAL   History of Present Illness:  Patient is a 82 y.o. year old female who presents for placement of a permanent hemodialysis access. The patient is right handed .  The patient is not currently on hemodialysis.  The cause of renal failure is thought to be secondary to hypertension and DM.  Other chronic medical problems include parkinson, hyperlipidemia managed with a statin, HTN managed with labetalol.  She does not take any anticoagulant medications.  Past Medical History  Diagnosis Date  . Asthma   . Allergic rhinoconjunctivitis   . Pulmonary hypertension ([place])   . Diastolic dysfunction   . GERD (gastroesophageal reflux disease)   . Hypertension   . Diabetes ([place])   . Hyperlipemia   . Degenerative lumbar disc   . Parkinson's disease ([place])   . H/O Graves' disease   . Chronic kidney disease   . Nephrotic syndrome   . DM (diabetes mellitus), type 2 with renal complications [facility])     Past Surgical History  Procedure Laterality Date  . Cholecystectomy    . Back surgery    . Vesicovaginal fistula closure w/ tah    . Appendectomy    . Breast biopsy       Social History Social History  Substance Use Topics  . Smoking status: Never Smoker   . Smokeless tobacco: Never Used  . Alcohol Use: No    Family History Family History  Problem Relation Age of Onset  . Heart disease Father     Allergies  Allergies  Allergen Reactions  . Levaquin [Levofloxacin] Other (See Comments)    Severe tendon inflammation  . Ace Inhibitors Cough    Cough  . Amlodipine     swelling  . Nitrofurantoin     rash  . Penicillins     rash  . Pentazocine     [place] - insomnia  . Pentazocine-Naloxone     Other reaction(s): ABDOMINAL PAIN  . Pentobarbital   . Streptomycin     Rash, insomnia  . Sulfa Antibiotics     rash     Current Outpatient Prescriptions  Medication Sig Dispense Refill  . albuterol (PROVENTIL HFA;VENTOLIN HFA) 108 (90 BASE) MCG/ACT inhaler  Inhale 1-2 puffs into the lungs every 6 (six) hours as needed for wheezing or shortness of breath. 18 g 4  . atorvastatin (LIPITOR) 80 MG tablet Take 80 mg by mouth daily.    Marland Kitchen BREO ELLIPTA 200-25 MCG/INH AEPB INHALE 1 PUFF INTO THE LUNGS DAILY 60 each 0  . calcitRIOL (ROCALTROL) 0.25 MCG capsule Take 1 capsule by mouth. Three times weekly    . carbidopa-levodopa (SINEMET IR) 25-100 MG per tablet Take 1 tablet by mouth 3 (three) times daily.    . cetirizine (ZYRTEC) 10 MG tablet Take 10 mg by mouth daily.    . cloNIDine (CATAPRES) 0.3 MG tablet Take 0.3 mg by mouth daily.    . EPOETIN ALFA IJ Inject as directed every 14 (fourteen) days.     Marland Kitchen Fe-Succ-C-Thre-B12-Des Stomach (MULTIGEN PO) Take by mouth daily.    . ferrous sulfate 325 (65 FE) MG tablet Take 325 mg by mouth every other day.    . folic acid (FOLVITE) [id number] MCG tablet Take 400 mcg by mouth daily.    . Furosemide (LASIX PO) Take 40 mg by mouth 2 (two) times daily at 10 am and 4 pm. And second dose if needed    . gabapentin (NEURONTIN) 300 MG capsule Take 300  mg by mouth 3 (three) times daily.    . Glucosamine-Chondroitin 250-200 MG CAPS Take by mouth 2 (two) times daily.    . hydrALAZINE (APRESOLINE) 100 MG tablet Take 100 mg by mouth 3 (three) times daily.    Marland Kitchen HYDROcodone-acetaminophen (NORCO/VICODIN) 5-325 MG per tablet Take 1 tablet by mouth 2 (two) times daily as needed.    . insulin aspart (NOVOLOG FLEXPEN) 100 UNIT/ML FlexPen Inject 20 Units into the skin 3 (three) times daily with meals.     . Insulin Detemir (LEVEMIR FLEXPEN) 100 UNIT/ML Pen Inject 36 Units into the skin at bedtime.     . isosorbide mononitrate (IMDUR) 120 MG 24 hr tablet Take 120 mg by mouth daily.    Marland Kitchen labetalol (NORMODYNE) 300 MG tablet Take 300 mg by mouth 2 (two) times daily.    Marland Kitchen levothyroxine (SYNTHROID, LEVOTHROID) 200 MCG tablet Take 200 mcg by mouth daily before breakfast.    . methocarbamol (ROBAXIN) 750 MG tablet Take 750 mg by mouth. Take 1 tablet  two times a day    . Methylcellulose, Laxative, (FIBER THERAPY PO) Take by mouth 2 (two) times daily.    . metolazone (ZAROXOLYN) 5 MG tablet Take 5 mg by mouth every other day.    . mometasone (NASONEX) 50 MCG/ACT nasal spray Place 2 sprays into the nose daily.    . Multiple Vitamin (MULTIVITAMIN) tablet Take 1 tablet by mouth daily.    . multivitamin-lutein (OCUVITE-LUTEIN) CAPS capsule Take 1 capsule by mouth daily.    Marland Kitchen omeprazole (PRILOSEC) 20 MG capsule Take 20 mg by mouth daily as needed.     Marland Kitchen rOPINIRole (REQUIP) 2 MG tablet Take 2 mg by mouth 3 (three) times daily.    . valsartan (DIOVAN) 160 MG tablet Take 160 mg by mouth daily.    Marland Kitchen acetaminophen (TYLENOL) 650 MG CR tablet Take 650 mg by mouth every 8 (eight) hours as needed for pain. Reported on 05/02/2015    . aspirin 81 MG tablet Take 81 mg by mouth daily. Reported on 05/02/2015    . cyclobenzaprine (FLEXERIL) 5 MG tablet Take 10 mg by mouth 2 (two) times daily. Reported on 05/02/2015    . tiZANidine (ZANAFLEX) 4 MG capsule Take 4 mg by mouth 3 (three) times daily. Reported on 05/02/2015     No current facility-administered medications for this visit.    ROS:   General:  No weight loss, Fever, chills  HEENT: No recent headaches, no nasal bleeding, no visual changes, no sore throat  Neurologic: No dizziness, blackouts, seizures. No recent symptoms of stroke or mini- stroke. No recent episodes of slurred speech, or temporary blindness.  Cardiac: No recent episodes of chest pain/pressure, no shortness of breath at rest.  No shortness of breath with exertion.  Denies history of atrial fibrillation or irregular heartbeat  Vascular: No history of rest pain in feet.  No history of claudication.  No history of non-healing ulcer, No history of DVT   Pulmonary: No home oxygen, no productive cough, no hemoptysis,  No asthma or wheezing  Musculoskeletal:  [ ]  Arthritis, [ ]  Low back pain,  [x ] Joint pain  Hematologic:No history of  hypercoagulable state.  No history of easy bleeding.  No history of anemia  Gastrointestinal: No hematochezia or melena,  No gastroesophageal reflux, no trouble swallowing  Urinary: [x ] chronic Kidney disease, [ ]  on HD - [ ]  MWF or [ ]  TTHS, [ ]  Burning with urination, [ ]  Frequent  urination, [ ]  Difficulty urinating;   Skin: No rashes  Psychological: No history of anxiety,  No history of depression   Physical Examination  Filed Vitals:   05/02/15 1259  BP: 119/61  Pulse: 82  Temp: 97.6 F (36.4 C)  TempSrc: Oral  Resp: 18  Height: 5' (1.524 m)  Weight: 194 lb (87.998 kg)  SpO2: 95%    Body mass index is 37.89 kg/(m^2).  General:  Alert and oriented, no acute distress HEENT: Normal Neck: No bruit or JVD Pulmonary: Clear to auscultation bilaterally Cardiac: Regular Rate and Rhythm without murmur Gastrointestinal: Soft, non-tender, non-distended, no mass, no scars Skin: No rash Extremity Pulses:  2+ radial, brachial pulses bilaterally Musculoskeletal: No deformity or edema  Neurologic: Upper and lower extremity motor 5/5 and symmetric  DATA:  She has an acceptable cephalic vein on the left UE.  0.28-0.34 cm diameter    ASSESSMENT:  CKD stage IV  PLAN: We will plan left brachial cephalic av fistula creation by Dr. Trula Slade on 05/27/2015.  We discussed the procedure and plan with her and her husband today and they are in agreement to proceed.    Theda Sers EMMA [facility] PA-C [facility] Office: [phone]  The patient was seen in conjunction with Dr. Trula Slade today.   I agree with the above.  This is an 82 year old female with stage V renal insufficiency.  She is here today for access planning.  I have reviewed her duplex.  She is a candidate for a left brachiocephalic fistula.  I discussed the risks and benefits of the procedure with the patient including the risk of non-maturity, the need for future interventions, and the risk of  steal syndrome.  All of her questions were answered.  Her procedure has been scheduled for Friday, April 21.  Annamarie Major

## 2015-05-04 ENCOUNTER — Encounter: Payer: Self-pay | Admitting: *Deleted

## 2015-05-04 ENCOUNTER — Other Ambulatory Visit: Payer: Self-pay | Admitting: *Deleted

## 2015-05-09 ENCOUNTER — Encounter: Payer: Self-pay | Admitting: *Deleted

## 2015-05-13 ENCOUNTER — Encounter (HOSPITAL_COMMUNITY): Payer: Self-pay | Admitting: *Deleted

## 2015-05-13 ENCOUNTER — Emergency Department (HOSPITAL_COMMUNITY): Payer: Medicare Other

## 2015-05-13 ENCOUNTER — Observation Stay (HOSPITAL_COMMUNITY)
Admission: EM | Admit: 2015-05-13 | Discharge: 2015-05-16 | Disposition: A | Discharge status: Home / Self Care | Payer: Medicare Other | Attending: Oncology | Admitting: Oncology

## 2015-05-13 DIAGNOSIS — M1990 Unspecified osteoarthritis, unspecified site: Secondary | ICD-10-CM | POA: Insufficient documentation

## 2015-05-13 DIAGNOSIS — G20 Parkinson's disease: Secondary | ICD-10-CM | POA: Diagnosis not present

## 2015-05-13 DIAGNOSIS — N200 Calculus of kidney: Secondary | ICD-10-CM | POA: Diagnosis not present

## 2015-05-13 DIAGNOSIS — I129 Hypertensive chronic kidney disease with stage 1 through stage 4 chronic kidney disease, or unspecified chronic kidney disease: Secondary | ICD-10-CM | POA: Diagnosis not present

## 2015-05-13 DIAGNOSIS — E0590 Thyrotoxicosis, unspecified without thyrotoxic crisis or storm: Secondary | ICD-10-CM | POA: Insufficient documentation

## 2015-05-13 DIAGNOSIS — Z794 Long term (current) use of insulin: Secondary | ICD-10-CM | POA: Diagnosis not present

## 2015-05-13 DIAGNOSIS — E860 Dehydration: Secondary | ICD-10-CM | POA: Diagnosis not present

## 2015-05-13 DIAGNOSIS — J449 Chronic obstructive pulmonary disease, unspecified: Secondary | ICD-10-CM | POA: Insufficient documentation

## 2015-05-13 DIAGNOSIS — I509 Heart failure, unspecified: Secondary | ICD-10-CM | POA: Diagnosis not present

## 2015-05-13 DIAGNOSIS — Z880 Allergy status to penicillin: Secondary | ICD-10-CM | POA: Diagnosis not present

## 2015-05-13 DIAGNOSIS — N184 Chronic kidney disease, stage 4 (severe): Secondary | ICD-10-CM | POA: Insufficient documentation

## 2015-05-13 DIAGNOSIS — R531 Weakness: Secondary | ICD-10-CM | POA: Diagnosis present

## 2015-05-13 DIAGNOSIS — Z79899 Other long term (current) drug therapy: Secondary | ICD-10-CM | POA: Diagnosis not present

## 2015-05-13 DIAGNOSIS — I519 Heart disease, unspecified: Secondary | ICD-10-CM | POA: Diagnosis not present

## 2015-05-13 DIAGNOSIS — K219 Gastro-esophageal reflux disease without esophagitis: Secondary | ICD-10-CM | POA: Diagnosis not present

## 2015-05-13 DIAGNOSIS — Z85828 Personal history of other malignant neoplasm of skin: Secondary | ICD-10-CM | POA: Diagnosis not present

## 2015-05-13 DIAGNOSIS — E785 Hyperlipidemia, unspecified: Secondary | ICD-10-CM | POA: Diagnosis not present

## 2015-05-13 DIAGNOSIS — J45909 Unspecified asthma, uncomplicated: Secondary | ICD-10-CM | POA: Insufficient documentation

## 2015-05-13 DIAGNOSIS — Z9981 Dependence on supplemental oxygen: Secondary | ICD-10-CM | POA: Insufficient documentation

## 2015-05-13 DIAGNOSIS — N179 Acute kidney failure, unspecified: Secondary | ICD-10-CM | POA: Diagnosis present

## 2015-05-13 DIAGNOSIS — E119 Type 2 diabetes mellitus without complications: Secondary | ICD-10-CM | POA: Diagnosis not present

## 2015-05-13 DIAGNOSIS — M5136 Other intervertebral disc degeneration, lumbar region: Secondary | ICD-10-CM | POA: Insufficient documentation

## 2015-05-13 DIAGNOSIS — E1129 Type 2 diabetes mellitus with other diabetic kidney complication: Secondary | ICD-10-CM | POA: Diagnosis present

## 2015-05-13 DIAGNOSIS — M109 Gout, unspecified: Secondary | ICD-10-CM | POA: Insufficient documentation

## 2015-05-13 DIAGNOSIS — Z7951 Long term (current) use of inhaled steroids: Secondary | ICD-10-CM | POA: Insufficient documentation

## 2015-05-13 HISTORY — DX: Unspecified osteoarthritis, unspecified site: M19.90

## 2015-05-13 HISTORY — DX: Dependence on supplemental oxygen: Z99.81

## 2015-05-13 HISTORY — DX: Hypothyroidism, unspecified: E03.9

## 2015-05-13 HISTORY — DX: Chronic kidney disease, stage 4 (severe): N18.4

## 2015-05-13 HISTORY — DX: Unspecified malignant neoplasm of skin of unspecified part of face: C44.300

## 2015-05-13 HISTORY — DX: Heart failure, unspecified: I50.9

## 2015-05-13 HISTORY — DX: Thyrotoxicosis, unspecified without thyrotoxic crisis or storm: E05.90

## 2015-05-13 HISTORY — DX: Calculus of kidney: N20.0

## 2015-05-13 LAB — COMPREHENSIVE METABOLIC PANEL
ALT: 10 U/L — ABNORMAL LOW (ref 14–54)
AST: 39 U/L (ref 15–41)
Albumin: 2.8 g/dL — ABNORMAL LOW (ref 3.5–5.0)
Alkaline Phosphatase: 96 U/L (ref 38–126)
Anion gap: 18 — ABNORMAL HIGH (ref 5–15)
BUN: 129 mg/dL — ABNORMAL HIGH (ref 6–20)
CO2: 21 mmol/L — ABNORMAL LOW (ref 22–32)
Calcium: 7.5 mg/dL — ABNORMAL LOW (ref 8.9–10.3)
Chloride: 100 mmol/L — ABNORMAL LOW (ref 101–111)
Creatinine, Ser: 5.24 mg/dL — ABNORMAL HIGH (ref 0.44–1.00)
GFR calc Af Amer: 8 mL/min — ABNORMAL LOW (ref >60)
GFR calc non Af Amer: 7 mL/min — ABNORMAL LOW (ref >60)
Glucose, Bld: 88 mg/dL (ref 65–99)
Potassium: 3.6 mmol/L (ref 3.5–5.1)
Sodium: 139 mmol/L (ref 135–145)
Total Bilirubin: 0.5 mg/dL (ref 0.3–1.2)
Total Protein: 6.7 g/dL (ref 6.5–8.1)

## 2015-05-13 LAB — GLUCOSE, CAPILLARY: GLUCOSE-CAPILLARY: 136 mg/dL — AB (ref 65–99)

## 2015-05-13 LAB — I-STAT CG4 LACTIC ACID, ED: Lactic Acid, Venous: 0.64 mmol/L (ref 0.5–2.0)

## 2015-05-13 LAB — CBC WITH DIFFERENTIAL/PLATELET
Basophils Absolute: 0.1 10*3/uL (ref 0.0–0.1)
Basophils Relative: 0 %
Eosinophils Absolute: 0.4 10*3/uL (ref 0.0–0.7)
Eosinophils Relative: 3 %
HCT: 29.8 % — ABNORMAL LOW (ref 36.0–46.0)
Hemoglobin: 8.9 g/dL — ABNORMAL LOW (ref 12.0–15.0)
Lymphocytes Relative: 4 %
Lymphs Abs: 0.6 10*3/uL — ABNORMAL LOW (ref 0.7–4.0)
MCH: 29.4 pg (ref 26.0–34.0)
MCHC: 29.9 g/dL — ABNORMAL LOW (ref 30.0–36.0)
MCV: 98.3 fL (ref 78.0–100.0)
Monocytes Absolute: 1.2 10*3/uL — ABNORMAL HIGH (ref 0.1–1.0)
Monocytes Relative: 7 %
Neutro Abs: 13.6 10*3/uL — ABNORMAL HIGH (ref 1.7–7.7)
Neutrophils Relative %: 86 %
Platelets: 278 10*3/uL (ref 150–400)
RBC: 3.03 MIL/uL — ABNORMAL LOW (ref 3.87–5.11)
RDW: 16.1 % — ABNORMAL HIGH (ref 11.5–15.5)
WBC: 15.8 10*3/uL — ABNORMAL HIGH (ref 4.0–10.5)

## 2015-05-13 LAB — CBG MONITORING, ED: Glucose-Capillary: 83 mg/dL (ref 65–99)

## 2015-05-13 MED ORDER — SODIUM CHLORIDE 0.9 % IV SOLN
INTRAVENOUS | Status: DC
  Administered 2015-05-13 – 2015-05-14 (×2): via INTRAVENOUS

## 2015-05-13 MED ORDER — ISOSORBIDE MONONITRATE ER 60 MG PO TB24
120.0000 mg | ORAL_TABLET | Freq: Every day | ORAL | Status: DC
  Administered 2015-05-14 – 2015-05-16 (×3): 120 mg via ORAL
  Filled 2015-05-13 (×3): qty 2

## 2015-05-13 MED ORDER — LABETALOL HCL 300 MG PO TABS
300.0000 mg | ORAL_TABLET | Freq: Two times a day (BID) | ORAL | Status: DC
  Administered 2015-05-13 – 2015-05-16 (×6): 300 mg via ORAL
  Filled 2015-05-13 (×6): qty 1

## 2015-05-13 MED ORDER — SODIUM CHLORIDE 0.9 % IV BOLUS (SEPSIS)
500.0000 mL | Freq: Once | INTRAVENOUS | Status: AC
  Administered 2015-05-13: 500 mL via INTRAVENOUS

## 2015-05-13 MED ORDER — LEVOTHYROXINE SODIUM 200 MCG PO TABS
200.0000 ug | ORAL_TABLET | Freq: Every day | ORAL | Status: DC
  Administered 2015-05-14 – 2015-05-16 (×3): 200 ug via ORAL
  Filled 2015-05-13 (×2): qty 1
  Filled 2015-05-13: qty 2
  Filled 2015-05-13: qty 1
  Filled 2015-05-13 (×2): qty 2

## 2015-05-13 MED ORDER — CARBIDOPA-LEVODOPA ER 50-200 MG PO TBCR
1.0000 | EXTENDED_RELEASE_TABLET | Freq: Three times a day (TID) | ORAL | Status: DC
  Administered 2015-05-13 – 2015-05-16 (×8): 1 via ORAL
  Filled 2015-05-13 (×9): qty 1

## 2015-05-13 MED ORDER — SODIUM CHLORIDE 0.9 % IV SOLN: INTRAVENOUS | Status: AC

## 2015-05-13 MED ORDER — ALBUTEROL SULFATE (2.5 MG/3ML) 0.083% IN NEBU: 2.5000 mg | INHALATION_SOLUTION | Freq: Four times a day (QID) | RESPIRATORY_TRACT | Status: DC | PRN

## 2015-05-13 MED ORDER — ALBUTEROL SULFATE HFA 108 (90 BASE) MCG/ACT IN AERS: 1.0000 | INHALATION_SPRAY | Freq: Four times a day (QID) | RESPIRATORY_TRACT | Status: DC | PRN

## 2015-05-13 MED ORDER — INSULIN ASPART 100 UNIT/ML ~~LOC~~ SOLN: 0.0000 [IU] | Freq: Three times a day (TID) | SUBCUTANEOUS | Status: DC

## 2015-05-13 MED ORDER — ENSURE ENLIVE PO LIQD
237.0000 mL | Freq: Two times a day (BID) | ORAL | Status: DC
  Administered 2015-05-14 (×2): 237 mL via ORAL

## 2015-05-13 MED ORDER — FLUTICASONE FUROATE-VILANTEROL 200-25 MCG/INH IN AEPB
1.0000 | INHALATION_SPRAY | Freq: Every day | RESPIRATORY_TRACT | Status: DC
  Administered 2015-05-14 – 2015-05-16 (×3): 1 via RESPIRATORY_TRACT
  Filled 2015-05-13: qty 28

## 2015-05-13 MED ORDER — GABAPENTIN 300 MG PO CAPS
300.0000 mg | ORAL_CAPSULE | Freq: Three times a day (TID) | ORAL | Status: DC
  Administered 2015-05-13 – 2015-05-16 (×8): 300 mg via ORAL
  Filled 2015-05-13 (×8): qty 1

## 2015-05-13 MED ORDER — INSULIN DETEMIR 100 UNIT/ML ~~LOC~~ SOLN
20.0000 [IU] | Freq: Every day | SUBCUTANEOUS | Status: DC
  Administered 2015-05-13 – 2015-05-14 (×2): 20 [IU] via SUBCUTANEOUS
  Filled 2015-05-13 (×3): qty 0.2

## 2015-05-13 MED ORDER — PANTOPRAZOLE SODIUM 40 MG PO TBEC
40.0000 mg | DELAYED_RELEASE_TABLET | Freq: Every day | ORAL | Status: DC
  Administered 2015-05-14 – 2015-05-16 (×3): 40 mg via ORAL
  Filled 2015-05-13 (×3): qty 1

## 2015-05-13 MED ORDER — INSULIN ASPART 100 UNIT/ML ~~LOC~~ SOLN: 0.0000 [IU] | Freq: Every day | SUBCUTANEOUS | Status: DC

## 2015-05-13 MED ORDER — CLONIDINE HCL 0.1 MG PO TABS
0.1000 mg | ORAL_TABLET | Freq: Every day | ORAL | Status: DC
  Administered 2015-05-13 – 2015-05-15 (×3): 0.1 mg via ORAL
  Filled 2015-05-13 (×3): qty 1

## 2015-05-13 MED ORDER — SODIUM CHLORIDE 0.9 % IV SOLN
INTRAVENOUS | Status: DC
  Administered 2015-05-13: 16:00:00 via INTRAVENOUS

## 2015-05-13 MED ORDER — ROPINIROLE HCL 1 MG PO TABS
2.0000 mg | ORAL_TABLET | Freq: Three times a day (TID) | ORAL | Status: DC
  Administered 2015-05-13 – 2015-05-16 (×8): 2 mg via ORAL
  Filled 2015-05-13 (×8): qty 2

## 2015-05-13 MED ORDER — HYDRALAZINE HCL 50 MG PO TABS
100.0000 mg | ORAL_TABLET | Freq: Three times a day (TID) | ORAL | Status: DC
  Administered 2015-05-13 – 2015-05-16 (×8): 100 mg via ORAL
  Filled 2015-05-13 (×8): qty 2

## 2015-05-13 MED ORDER — HEPARIN SODIUM (PORCINE) 5000 UNIT/ML IJ SOLN
5000.0000 [IU] | Freq: Three times a day (TID) | INTRAMUSCULAR | Status: DC
  Administered 2015-05-13 – 2015-05-16 (×8): 5000 [IU] via SUBCUTANEOUS
  Filled 2015-05-13 (×8): qty 1
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
## 2015-05-13 NOTE — H&P (Signed)
Date: 05/13/2015               Patient Name:  Katelyn Dunlap MRN: [id number]  DOB: 01-04-1932 Age / Sex: 82 y.o., female   PCP: Katelyn Roger, MD         Medical Service: Internal Medicine Teaching Service         Attending Physician: Dr. Sid Falcon, MD    First Contact: Dr. Liberty Dunlap Pager: [phone]  Second Contact: Dr. Charlott Dunlap Pager: [phone]       After Hours (After 5p/  First Contact Pager: [phone]  weekends / holidays): Second Contact Pager: [phone]   Chief Complaint: Dehydration  History of Present Illness:  Katelyn Dunlap is an 82 yo woman with PMH of Parkinson's, CKD stage 4, asthma, T2DM, dCHF who presents with decreased appetite and an AKI detected by her nephrologist. The patient, who also takes metolazone every other day, recently had her furosemide increased from 80 mg BID to 160 mg BID. In the time, she has gone from 194 lbs to 180 lbs. She denies any increased urinary frequency, going about four times daily. Since this change, she has also been eating very little. She complains of some left big toe pain for which she was given antibiotics. She cannot remember the antibiotic she was given and she reports that it is not appreciably improving - she has two more antibiotic pills left. She indicates she would get leg swelling, but this is held at bay by stockings. She endorses some dyspnea on exertion and orthopnea. She denies any chest pain, shortness of breath at rest, nausea, vomiting, diarrhea, constipation, dysuria, blackouts, new one sided weakness, thirstiness. There are plans place a fistula later this month for dialysis. The patient needs help at home bathing, but can feed herself. Her husband assists with some ADLs. She likes to read and go to the beauty shop for fun. She does not smoke or drink alcohol. In the ED, BUN 129 Cr 5.2, unclear baseline - patient does not know. She was mildly hypertensive at 158/64.    Meds: Current Facility-Administered Medications    Medication Dose Route Frequency Provider Last Rate Last Dose  . 0.9 %  sodium chloride infusion   Intravenous Continuous Katelyn Manifold, MD      . 0.9 %  sodium chloride infusion   Intravenous Continuous Katelyn Sox, MD      . albuterol (PROVENTIL) (2.5 MG/3ML) 0.083% nebulizer solution 2.5 mg  2.5 mg Nebulization Q6H PRN Katelyn Falcon, MD      . carbidopa-levodopa (SINEMET CR) 50-200 MG per tablet controlled release 1 tablet  1 tablet Oral TID Katelyn Sox, MD      . cloNIDine (CATAPRES) tablet 0.1 mg  0.1 mg Oral QHS Katelyn Sox, MD      . Derrill Memo ON 05/14/2015] fluticasone furoate-vilanterol (BREO ELLIPTA) 200-25 MCG/INH 1 puff  1 puff Inhalation Daily Katelyn Sox, MD      . gabapentin (NEURONTIN) capsule 300 mg  300 mg Oral TID Katelyn Sox, MD      . heparin injection 5,000 Units  5,000 Units Subcutaneous 3 times per day Katelyn Sox, MD      . hydrALAZINE (APRESOLINE) tablet 100 mg  100 mg Oral TID Katelyn Sox, MD      . insulin aspart (novoLOG) injection 0-5 Units  0-5 Units Subcutaneous QHS Katelyn Sox, MD      . Derrill Memo ON 05/14/2015] insulin aspart (  novoLOG) injection 0-9 Units  0-9 Units Subcutaneous TID WC Katelyn Sox, MD      . insulin detemir (LEVEMIR) injection 20 Units  20 Units Subcutaneous QHS Katelyn Sox, MD      . Derrill Memo ON 05/14/2015] isosorbide mononitrate (IMDUR) 24 hr tablet 120 mg  120 mg Oral Daily Katelyn Sox, MD      . labetalol (NORMODYNE) tablet 300 mg  300 mg Oral BID Katelyn Sox, MD      . Derrill Memo ON 05/14/2015] levothyroxine (SYNTHROID, LEVOTHROID) tablet 200 mcg  200 mcg Oral QAC breakfast Katelyn Sox, MD      . Derrill Memo ON 05/14/2015] pantoprazole (PROTONIX) EC tablet 40 mg  40 mg Oral Daily Katelyn Sox, MD      . rOPINIRole (REQUIP) tablet 2 mg  2 mg Oral TID Katelyn Sox, MD        Allergies: Allergies as of 05/13/2015 - Review Complete 05/13/2015  Allergen Reaction Noted  . Levaquin [levofloxacin] Other (See Comments) 03/09/2014  . Ace inhibitors Cough  09/01/2013  . Amlodipine    . Nitrofurantoin    . Penicillins    . Pentazocine    . Pentazocine-naloxone  09/01/2013  . Pentobarbital    . Streptomycin    . Sulfa antibiotics     Past Medical History  Diagnosis Date  . Asthma   . Allergic rhinoconjunctivitis   . Pulmonary hypertension ([place])   . Diastolic dysfunction   . GERD (gastroesophageal reflux disease)   . Hypertension   . Diabetes ([place])   . Hyperlipemia   . Degenerative lumbar disc   . Parkinson's disease ([place])   . H/O Graves' disease   . Chronic kidney disease   . Nephrotic syndrome   . DM (diabetes mellitus), type 2 with renal complications [facility])    Past Surgical History  Procedure Laterality Date  . Cholecystectomy    . Back surgery    . Vesicovaginal fistula closure w/ tah    . Appendectomy    . Breast biopsy     Family History  Problem Relation Age of Onset  . Heart disease Father    Social History   Social History  . Marital Status: Married    Spouse Name: N/A  . Number of Children: N/A  . Years of Education: N/A   Occupational History  . retired     [employer]   Social History Main Topics  . Smoking status: Never Smoker   . Smokeless tobacco: Never Used  . Alcohol Use: No  . Drug Use: No  . Sexual Activity: Not on file   Other Topics Concern  . Not on file   Social History Narrative    Review of Systems: All systems negative except per HPI  Physical Exam: Blood pressure 158/64, pulse 85, temperature 97.8 F (36.6 C), temperature source Oral, resp. rate 20, height 5' (1.524 m), weight 182 lb (82.555 kg), SpO2 95 %. General: Lying in bed, NAD HEENT: Dry MM with tongue furrowing. No tonsillar exudate or erythema. No scleral icterus, normal conjunctiva. EOMI. Cardiovascular: RRR no m/r/g. Pulmonary: CTAB. No respiratory distress Abdominal: Obese., Soft NT/ND Extremities: Dusky shins. No clubbing or appreciable edema. Slightly red left toe without skin breakdown and  mildly TTP Neurological: AAOx3. Some rigidity but no tremors Skin: Warm and dry Psychiatric: Normal behavior and affect   Lab results: Basic Metabolic Panel:  Recent Labs  05/13/15 1303  NA 139  K 3.6  CL 100*  CO2 21*  GLUCOSE 88  BUN 129*  CREATININE 5.24*  CALCIUM 7.5*   Liver Function Tests:  Recent Labs  05/13/15 1303  AST 39  ALT 10*  ALKPHOS 96  BILITOT 0.5  PROT 6.7  ALBUMIN 2.8*   CBC:  Recent Labs  05/13/15 1303  WBC 15.8*  NEUTROABS 13.6*  HGB 8.9*  HCT 29.8*  MCV 98.3  PLT 278   CBG:  Recent Labs  05/13/15 1311  GLUCAP 83    Imaging results:  Dg Chest 2 View  05/13/2015  CLINICAL DATA:  Cough and shortness of breath for 1 week EXAM: CHEST  2 VIEW COMPARISON:  None. FINDINGS: Cardiac shadow is mildly enlarged. No focal infiltrate is seen. Mild central vascular congestion is noted without significant edema. No effusion is seen. No bony abnormality is noted. IMPRESSION: Mild central vascular congestion without focal infiltrate or edema. Electronically Signed   By: Inez Catalina M.D.   On: 05/13/2015 13:45   Assessment & Plan by Problem:  AKI on Superimposed CKD4: Creatinine of 2.56 in Care everywhere (07/2014). Cr now in 5's and almost certainly prerenal in setting over-diuresis. Patient not confused, no rub on cardiac exam.  - Hold Lasix - 100 cc/hr normal saline for 12 hrs. - BMET in AM  T2DM with neuropathy: On Levemir 36U at bedtime, Novolog 20U TID at home. Glucose 83. - SSI sensitive - Levemir 20U qhs - Gabapentin 300 mg TID  Left Toe Infection: Small area of erythema, unclear antibiotic being used, but is near end of course. - Consider re-initiated abx  Hypothyroidism: Continue 200 mcg daily  HTN: Stable - Clonidine 0.1 mg at bedime - Hydralazine 1000 mg TID - Imdur 120 mg daily - Labetolol 300 mg BID - Holding lasix and metolazone  GERD: Protonix  Parkinson's: Sinemet 50-200mg  TID, Ropinirole 2 mg TID  Asthma: Normal  lung exam - Continue Breo, prn albuterol  DVT Prophylaxis: Heparin [place]  Dispo: Disposition is deferred at this time, awaiting improvement of current medical problems. Anticipated discharge in approximately 1-2 day(s).   The patient does have a current PCP Katelyn Roger, MD) and does not need an [facility] hospital follow-up appointment after discharge.  The patient does have transportation limitations that hinder transportation to clinic appointments.  Signed: Liberty Handy, MD 05/13/2015, 7:35 PM

## 2015-05-13 NOTE — ED Provider Notes (Signed)
CSN: [id number]     Arrival date & time 05/13/15  1249 History   First MD Initiated Contact with Patient 05/13/15 1435     Chief Complaint  Patient presents with  . Weakness     (Consider location/radiation/quality/duration/timing/severity/associated sxs/prior Treatment) HPI   82 yo woman with PMH of Parkinson's, CKD stage 4, asthma, T2DM, dCHF who presents with decreased appetite and an AKI detected by her nephrologist. The patient, who also takes metolazone every other day, recently had her furosemide increased from 80 mg BID to 160 mg BID. In the time, she has gone from 194 lbs to 180 lbs. She denies any increased urinary frequency, going about four times daily. Since this change, she has also been eating very little.. She endorses some dyspnea on exertion and orthopnea. She denies any chest pain, shortness of breath at rest, nausea, vomiting, diarrhea, constipation, dysuria, blackouts, new one sided weakness, thirstiness. Scheduled of AV fistula later this month.   Past Medical History  Diagnosis Date  . Asthma   . Allergic rhinoconjunctivitis   . Pulmonary hypertension ([place])   . Diastolic dysfunction   . GERD (gastroesophageal reflux disease)   . Hypertension   . Diabetes ([place])   . Hyperlipemia   . Degenerative lumbar disc   . Parkinson's disease ([place])   . H/O Graves' disease   . Chronic kidney disease   . Nephrotic syndrome   . DM (diabetes mellitus), type 2 with renal complications [facility])    Past Surgical History  Procedure Laterality Date  . Cholecystectomy    . Back surgery    . Vesicovaginal fistula closure w/ tah    . Appendectomy    . Breast biopsy     Family History  Problem Relation Age of Onset  . Heart disease Father    Social History  Substance Use Topics  . Smoking status: Never Smoker   . Smokeless tobacco: Never Used  . Alcohol Use: No   OB History    No data available     Review of Systems    Allergies  Levaquin; Ace inhibitors; Amlodipine;  Nitrofurantoin; Penicillins; Pentazocine; Pentazocine-naloxone; Pentobarbital; Streptomycin; and Sulfa antibiotics  Home Medications   Prior to Admission medications   Medication Sig Start Date End Date Taking? Authorizing Provider  acetaminophen (TYLENOL) 650 MG CR tablet Take 650 mg by mouth every 8 (eight) hours as needed for pain. Reported on 05/02/2015    Historical Provider, MD  albuterol (PROVENTIL HFA;VENTOLIN HFA) 108 (90 BASE) MCG/ACT inhaler Inhale 1-2 puffs into the lungs every 6 (six) hours as needed for wheezing or shortness of breath. 03/09/14   Elsie Stain, MD  aspirin 81 MG tablet Take 81 mg by mouth daily. Reported on 05/02/2015    Historical Provider, MD  atorvastatin (LIPITOR) 80 MG tablet Take 80 mg by mouth daily.    Historical Provider, MD  BREO ELLIPTA 200-25 MCG/INH AEPB INHALE 1 PUFF INTO THE LUNGS DAILY 02/28/15   Tammy S Parrett, NP  calcitRIOL (ROCALTROL) 0.25 MCG capsule Take 1 capsule by mouth. Three times weekly    Historical Provider, MD  carbidopa-levodopa (SINEMET IR) 25-100 MG per tablet Take 1 tablet by mouth 3 (three) times daily.    Historical Provider, MD  cetirizine (ZYRTEC) 10 MG tablet Take 10 mg by mouth daily.    Historical Provider, MD  cloNIDine (CATAPRES) 0.3 MG tablet Take 0.3 mg by mouth daily.    Historical Provider, MD  cyclobenzaprine (FLEXERIL) 5 MG tablet Take 10  mg by mouth 2 (two) times daily. Reported on 05/02/2015    Historical Provider, MD  EPOETIN ALFA IJ Inject as directed every 14 (fourteen) days.     Historical Provider, MD  Fe-Succ-C-Thre-B12-Des Stomach (MULTIGEN PO) Take by mouth daily.    Historical Provider, MD  ferrous sulfate 325 (65 FE) MG tablet Take 325 mg by mouth every other day.    Historical Provider, MD  folic acid (FOLVITE) [id number] MCG tablet Take 400 mcg by mouth daily.    Historical Provider, MD  Furosemide (LASIX PO) Take 40 mg by mouth 2 (two) times daily at 10 am and 4 pm. And second dose if needed    Historical  Provider, MD  gabapentin (NEURONTIN) 300 MG capsule Take 300 mg by mouth 3 (three) times daily.    Historical Provider, MD  Glucosamine-Chondroitin 250-200 MG CAPS Take by mouth 2 (two) times daily.    Historical Provider, MD  hydrALAZINE (APRESOLINE) 100 MG tablet Take 100 mg by mouth 3 (three) times daily.    Historical Provider, MD  HYDROcodone-acetaminophen (NORCO/VICODIN) 5-325 MG per tablet Take 1 tablet by mouth 2 (two) times daily as needed.    Historical Provider, MD  insulin aspart (NOVOLOG FLEXPEN) 100 UNIT/ML FlexPen Inject 20 Units into the skin 3 (three) times daily with meals.     Historical Provider, MD  Insulin Detemir (LEVEMIR FLEXPEN) 100 UNIT/ML Pen Inject 36 Units into the skin at bedtime.     Historical Provider, MD  isosorbide mononitrate (IMDUR) 120 MG 24 hr tablet Take 120 mg by mouth daily.    Historical Provider, MD  labetalol (NORMODYNE) 300 MG tablet Take 300 mg by mouth 2 (two) times daily.    Historical Provider, MD  levothyroxine (SYNTHROID, LEVOTHROID) 200 MCG tablet Take 200 mcg by mouth daily before breakfast.    Historical Provider, MD  methocarbamol (ROBAXIN) 750 MG tablet Take 750 mg by mouth. Take 1 tablet two times a day    Historical Provider, MD  Methylcellulose, Laxative, (FIBER THERAPY PO) Take by mouth 2 (two) times daily.    Historical Provider, MD  metolazone (ZAROXOLYN) 5 MG tablet Take 5 mg by mouth every other day.    Historical Provider, MD  mometasone (NASONEX) 50 MCG/ACT nasal spray Place 2 sprays into the nose daily.    Historical Provider, MD  Multiple Vitamin (MULTIVITAMIN) tablet Take 1 tablet by mouth daily.    Historical Provider, MD  multivitamin-lutein (OCUVITE-LUTEIN) CAPS capsule Take 1 capsule by mouth daily.    Historical Provider, MD  omeprazole (PRILOSEC) 20 MG capsule Take 20 mg by mouth daily as needed.     Historical Provider, MD  rOPINIRole (REQUIP) 2 MG tablet Take 2 mg by mouth 3 (three) times daily.    Historical Provider, MD   tiZANidine (ZANAFLEX) 4 MG capsule Take 4 mg by mouth 3 (three) times daily. Reported on 05/02/2015    Historical Provider, MD  valsartan (DIOVAN) 160 MG tablet Take 160 mg by mouth daily.    Historical Provider, MD   BP 129/67 mmHg  Pulse 79  Temp(Src) 97.5 F (36.4 C) (Oral)  Resp 23  Ht 5' (1.524 m)  Wt 182 lb (82.555 kg)  BMI 35.54 kg/m2  SpO2 96% Physical Exam  Constitutional: She appears well-developed and well-nourished. No distress.  HENT:  Head: Normocephalic and atraumatic.  Eyes: Conjunctivae are normal. Right eye exhibits no discharge. Left eye exhibits no discharge.  Neck: Neck supple.  Cardiovascular: Normal rate, regular rhythm and  normal heart sounds.  Exam reveals no gallop and no friction rub.   No murmur heard. Pulmonary/Chest: Effort normal and breath sounds normal. No respiratory distress.  Abdominal: Soft. She exhibits no distension. There is no tenderness.  Musculoskeletal: She exhibits edema. She exhibits no tenderness.  Neurological:  Somewhat drowsy but answering questions appropriately. Follows commands. CN2-12 intact. Mild global weakness. No focal motor deficit.   Skin: Skin is warm and dry.  Psychiatric: She has a normal mood and affect. Her behavior is normal. Thought content normal.  Nursing note and vitals reviewed.   ED Course  Procedures (including critical care time) Labs Review Labs Reviewed  COMPREHENSIVE METABOLIC PANEL - Abnormal; Notable for the following:    Chloride 100 (*)    CO2 21 (*)    BUN 129 (*)    Creatinine, Ser 5.24 (*)    Calcium 7.5 (*)    Albumin 2.8 (*)    ALT 10 (*)    GFR calc non Af Amer 7 (*)    GFR calc Af Amer 8 (*)    Anion gap 18 (*)    All other components within normal limits  CBC WITH DIFFERENTIAL/PLATELET - Abnormal; Notable for the following:    WBC 15.8 (*)    RBC 3.03 (*)    Hemoglobin 8.9 (*)    HCT 29.8 (*)    MCHC 29.9 (*)    RDW 16.1 (*)    Neutro Abs 13.6 (*)    Lymphs Abs 0.6 (*)     Monocytes Absolute 1.2 (*)    All other components within normal limits  URINE CULTURE  URINALYSIS, ROUTINE W REFLEX MICROSCOPIC (NOT AT [facility])  I-STAT CG4 LACTIC ACID, ED  CBG MONITORING, ED    Imaging Review Dg Chest 2 View  05/13/2015  CLINICAL DATA:  Cough and shortness of breath for 1 week EXAM: CHEST  2 VIEW COMPARISON:  None. FINDINGS: Cardiac shadow is mildly enlarged. No focal infiltrate is seen. Mild central vascular congestion is noted without significant edema. No effusion is seen. No bony abnormality is noted. IMPRESSION: Mild central vascular congestion without focal infiltrate or edema. Electronically Signed   By: Inez Catalina M.D.   On: 05/13/2015 13:45   I have personally reviewed and evaluated these images and lab results as part of my medical decision-making.   EKG Interpretation None      MDM   Final diagnoses:  Dehydration  Acute renal failure superimposed on stage 4 chronic kidney disease ([place])    84yF with increasing weakness/fatigue.  -Don't have prior labs for comparison, but sounds like advanced CKD. Lasix doubled last week and has continued to take despite poor PO intake for the past few days. BUN 129 with Cr of 5.2. Clinically dehydrated. Likely contributing to a lot of symptoms. IVF. Admit. Nephrologist: Justin Mend. PCP: Dr Nona Dell, [facility].   Virgel Manifold, MD 05/16/15 [phone]

## 2015-05-13 NOTE — ED Notes (Signed)
Attempted report to 6E 

## 2015-05-13 NOTE — ED Notes (Signed)
Pt states she needs to urinate and has been holding it. Pt informed she will have to use a bedpan. Pt states she will wait until she gets to her room.

## 2015-05-13 NOTE — ED Notes (Addendum)
Pt sent here by Dr Justin Mend "Because I'm dehydrated" per pt.  States decreased appetite, nausea and increased difficulty ambulating.  Pt due for dialysis graft placement to L arm on 4/21.  Husband states pt is not alert per her norm for the last week.

## 2015-05-14 DIAGNOSIS — J45909 Unspecified asthma, uncomplicated: Secondary | ICD-10-CM

## 2015-05-14 DIAGNOSIS — I129 Hypertensive chronic kidney disease with stage 1 through stage 4 chronic kidney disease, or unspecified chronic kidney disease: Secondary | ICD-10-CM | POA: Diagnosis not present

## 2015-05-14 DIAGNOSIS — Z794 Long term (current) use of insulin: Secondary | ICD-10-CM

## 2015-05-14 DIAGNOSIS — E1122 Type 2 diabetes mellitus with diabetic chronic kidney disease: Secondary | ICD-10-CM | POA: Diagnosis not present

## 2015-05-14 DIAGNOSIS — L0889 Other specified local infections of the skin and subcutaneous tissue: Secondary | ICD-10-CM

## 2015-05-14 DIAGNOSIS — Z79899 Other long term (current) drug therapy: Secondary | ICD-10-CM

## 2015-05-14 DIAGNOSIS — N184 Chronic kidney disease, stage 4 (severe): Secondary | ICD-10-CM | POA: Diagnosis not present

## 2015-05-14 DIAGNOSIS — M109 Gout, unspecified: Secondary | ICD-10-CM

## 2015-05-14 DIAGNOSIS — N179 Acute kidney failure, unspecified: Secondary | ICD-10-CM | POA: Diagnosis not present

## 2015-05-14 DIAGNOSIS — B9689 Other specified bacterial agents as the cause of diseases classified elsewhere: Secondary | ICD-10-CM

## 2015-05-14 LAB — BASIC METABOLIC PANEL
Anion gap: 17 — ABNORMAL HIGH (ref 5–15)
BUN: 117 mg/dL — ABNORMAL HIGH (ref 6–20)
CO2: 20 mmol/L — ABNORMAL LOW (ref 22–32)
Calcium: 7.3 mg/dL — ABNORMAL LOW (ref 8.9–10.3)
Chloride: 105 mmol/L (ref 101–111)
Creatinine, Ser: 4.67 mg/dL — ABNORMAL HIGH (ref 0.44–1.00)
GFR calc Af Amer: 9 mL/min — ABNORMAL LOW (ref >60)
GFR calc non Af Amer: 8 mL/min — ABNORMAL LOW (ref >60)
Glucose, Bld: 80 mg/dL (ref 65–99)
Potassium: 3.4 mmol/L — ABNORMAL LOW (ref 3.5–5.1)
Sodium: 142 mmol/L (ref 135–145)

## 2015-05-14 LAB — GLUCOSE, CAPILLARY
GLUCOSE-CAPILLARY: 295 mg/dL — AB (ref 65–99)
Glucose-Capillary: 66 mg/dL (ref 65–99)
Glucose-Capillary: 73 mg/dL (ref 65–99)
Glucose-Capillary: 196 mg/dL — ABNORMAL HIGH (ref 65–99)
Glucose-Capillary: 325 mg/dL — ABNORMAL HIGH (ref 65–99)

## 2015-05-14 LAB — URINALYSIS, ROUTINE W REFLEX MICROSCOPIC
Bilirubin Urine: NEGATIVE
Glucose, UA: NEGATIVE mg/dL
Hgb urine dipstick: NEGATIVE
Ketones, ur: NEGATIVE mg/dL
Nitrite: NEGATIVE
Protein, ur: 100 mg/dL — AB
Specific Gravity, Urine: 1.012 (ref 1.005–1.030)
pH: 5 (ref 5.0–8.0)

## 2015-05-14 LAB — URINE MICROSCOPIC-ADD ON

## 2015-05-14 LAB — CBC
HCT: 27.7 % — ABNORMAL LOW (ref 36.0–46.0)
Hemoglobin: 8.4 g/dL — ABNORMAL LOW (ref 12.0–15.0)
MCH: 29.8 pg (ref 26.0–34.0)
MCHC: 30.3 g/dL (ref 30.0–36.0)
MCV: 98.2 fL (ref 78.0–100.0)
Platelets: 280 10*3/uL (ref 150–400)
RBC: 2.82 MIL/uL — ABNORMAL LOW (ref 3.87–5.11)
RDW: 16.1 % — ABNORMAL HIGH (ref 11.5–15.5)
WBC: 13.7 10*3/uL — ABNORMAL HIGH (ref 4.0–10.5)

## 2015-05-14 MED ORDER — NEPRO/CARBSTEADY PO LIQD
237.0000 mL | ORAL | Status: DC
  Administered 2015-05-15: 237 mL via ORAL

## 2015-05-14 MED ORDER — FUROSEMIDE 80 MG PO TABS: 80.0000 mg | ORAL_TABLET | Freq: Two times a day (BID) | ORAL | Status: AC

## 2015-05-14 MED ORDER — INSULIN ASPART 100 UNIT/ML ~~LOC~~ SOLN
0.0000 [IU] | Freq: Three times a day (TID) | SUBCUTANEOUS | Status: DC
  Administered 2015-05-14: 3 [IU] via SUBCUTANEOUS
  Administered 2015-05-14: 11 [IU] via SUBCUTANEOUS
  Administered 2015-05-15: 3 [IU] via SUBCUTANEOUS

## 2015-05-14 MED ORDER — PREDNISONE 50 MG PO TABS
50.0000 mg | ORAL_TABLET | Freq: Every day | ORAL | Status: DC
  Administered 2015-05-14 – 2015-05-16 (×3): 50 mg via ORAL
  Filled 2015-05-14 (×3): qty 1

## 2015-05-14 MED ORDER — CLONIDINE HCL 0.1 MG PO TABS: 0.1000 mg | ORAL_TABLET | Freq: Every day | ORAL | Status: AC

## 2015-05-14 MED ORDER — PREDNISONE 50 MG PO TABS: 50.0000 mg | ORAL_TABLET | Freq: Every day | ORAL | Status: DC

## 2015-05-14 MED ORDER — INSULIN ASPART 100 UNIT/ML ~~LOC~~ SOLN
0.0000 [IU] | Freq: Every day | SUBCUTANEOUS | Status: DC
  Administered 2015-05-14: 3 [IU] via SUBCUTANEOUS

## 2015-05-14 MED ORDER — CARBIDOPA-LEVODOPA ER 50-200 MG PO TBCR: 1.0000 | EXTENDED_RELEASE_TABLET | Freq: Three times a day (TID) | ORAL | Status: AC

## 2015-05-14 NOTE — Progress Notes (Signed)
Initial Nutrition Assessment  DOCUMENTATION CODES:  Obesity unspecified  INTERVENTION:  Nepro Shake po q 24 hrs, each supplement provides 425 kcal and 19 grams protein  Gave brief dietary recommendations for DM, Low Na, and renal diets.   NUTRITION DIAGNOSIS:  Unintentional weight loss related to over-diuresis as evidenced by loss of >5% bw in 1 month   GOAL:  Patient will meet greater than or equal to 90% of their needs  MONITOR:  PO intake, Labs, Weight trends, Supplement acceptance  REASON FOR ASSESSMENT:  Malnutrition Screening Tool    ASSESSMENT:  82 y/o female PMHx Parkinsons, CKD 4, dm2, CHF who presented with decreased appetite and AKI. Admitted for dehydration r/t over-diuresis.   Patient reports having minimal appetite. There is no associated n/v/c/d.  Husband reports that at home pt has a very fluctuating appetite that has read to some hypoglycemic events.   RD mostly addressed dietary concerns related to her diabetes, her salt intake and her upcoming need for renal diet. Husband is the one who makes the meals. He states that their diet includes many high sodium foods. They eat out fairly frequently and eat items such as condensed soup. Husband asked about the need to switch from diet coke to diet sprite as well as what were appropriate blood sugars at night.   Given pt's poor appetite, she was agreeable to supplementation. They wanted to try Nepro as she likely will be given this when HD starts.   NFPE: no wasting, BLE edema.   Labs reviewed: WBC: 13.7, H/H: 2.82/8.4, albumin: 2.8   Recent Labs Lab 05/13/15 1303 05/14/15 0538  NA 139 142  K 3.6 3.4*  CL 100* 105  CO2 21* 20*  BUN 129* 117*  CREATININE 5.24* 4.67*  CALCIUM 7.5* 7.3*  GLUCOSE 88 80   Diet Order:  Diet Heart Room service appropriate?: Yes; Fluid consistency:: Thin  Skin: Dry, ecchymotic   Last BM:  Unknown  Height:  Ht Readings from Last 1 Encounters:  05/13/15 5' (1.524 m)     Weight:  Wt Readings from Last 1 Encounters:  05/13/15 182 lb (82.555 kg)   Wt Readings from Last 10 Encounters:  05/13/15 182 lb (82.555 kg)  05/02/15 194 lb (87.998 kg)  07/27/14 201 lb 12.8 oz (91.536 kg)  03/09/14 202 lb (91.627 kg)  10/06/13 197 lb (89.359 kg)  09/01/13 196 lb (88.905 kg)   Ideal Body Weight:  45.45 kg  BMI:  Body mass index is 35.54 kg/(m^2).  Estimated Nutritional Needs:  Kcal:  1300-1500 kcals (16-18 kcal/kg bw) Protein:  50-65 g (.6-.8 g/kg bw) Fluid:  Per MD  EDUCATION NEEDS:  Education needs addressed  Burtis Junes RD, LDN Clinical Nutrition Pager: [phone] 05/14/2015 6:05 PM

## 2015-05-14 NOTE — Progress Notes (Signed)
Subjective:  Katelyn Dunlap is complaining of some shortness of breath this morning. She reports that her appetite has not yet returned. She endorses unchanged urine output. She also complains of her left toe pain, and she prefers to leave the sheet off her her feet to avoid irritating it.   Objective: Vital signs in last 24 hours: Filed Vitals:   05/13/15 2115 05/14/15 0500 05/14/15 0917 05/14/15 1014  BP: 148/60 162/56 160/71   Pulse: 87 90 90   Temp: 98.9 F (37.2 C) 97.7 F (36.5 C) 98.4 F (36.9 C)   TempSrc: Oral Oral Oral   Resp: 21 20 20    Height:      Weight:      SpO2: 93% 92% 94% 92%   Weight change:   Intake/Output Summary (Last 24 hours) at 05/14/15 1311 Last data filed at 05/14/15 1100  Gross per 24 hour  Intake 2103.33 ml  Output   1600 ml  Net 503.33 ml   General: Lying in bed, NAD HEENT: mmm. No tonsillar exudate or erythema. Cardiovascular: RRR no m/r/g. Pulmonary: Course breath sounds today with diminished excursions. No respiratory distress. Abdominal: Obese., Soft NT/ND Extremities: Dusky shins. No clubbing or appreciable edema. Red left toe without skin breakdown and mildly TTP Neurological: AAOx3. Some rigidity but no tremors Skin: Warm and dry Psychiatric: Normal behavior and affect Lab Results: Basic Metabolic Panel:  Recent Labs Lab 05/13/15 1303 05/14/15 0538  NA 139 142  K 3.6 3.4*  CL 100* 105  CO2 21* 20*  GLUCOSE 88 80  BUN 129* 117*  CREATININE 5.24* 4.67*  CALCIUM 7.5* 7.3*   Liver Function Tests:  Recent Labs Lab 05/13/15 1303  AST 39  ALT 10*  ALKPHOS 96  BILITOT 0.5  PROT 6.7  ALBUMIN 2.8*   CBC:  Recent Labs Lab 05/13/15 1303 05/14/15 0538  WBC 15.8* 13.7*  NEUTROABS 13.6*  --   HGB 8.9* 8.4*  HCT 29.8* 27.7*  MCV 98.3 98.2  PLT 278 280   CBG:  Recent Labs Lab 05/13/15 1311 05/13/15 2211 05/14/15 0712 05/14/15 0745 05/14/15 1124  GLUCAP 83 136* 66 73 196*    Micro Results: No results found  for this or any previous visit (from the past 240 hour(s)). Studies/Results: Dg Chest 2 View  05/13/2015  CLINICAL DATA:  Cough and shortness of breath for 1 week EXAM: CHEST  2 VIEW COMPARISON:  None. FINDINGS: Cardiac shadow is mildly enlarged. No focal infiltrate is seen. Mild central vascular congestion is noted without significant edema. No effusion is seen. No bony abnormality is noted. IMPRESSION: Mild central vascular congestion without focal infiltrate or edema. Electronically Signed   By: Inez Catalina M.D.   On: 05/13/2015 13:45   Medications: I have reviewed the patient's current medications. Scheduled Meds: . carbidopa-levodopa  1 tablet Oral TID  . cloNIDine  0.1 mg Oral QHS  . feeding supplement (ENSURE ENLIVE)  237 mL Oral BID BM  . fluticasone furoate-vilanterol  1 puff Inhalation Daily  . gabapentin  300 mg Oral TID  . heparin  5,000 Units Subcutaneous 3 times per day  . hydrALAZINE  100 mg Oral TID  . insulin aspart  0-15 Units Subcutaneous TID WC  . insulin aspart  0-5 Units Subcutaneous QHS  . insulin detemir  20 Units Subcutaneous QHS  . isosorbide mononitrate  120 mg Oral Daily  . labetalol  300 mg Oral BID  . levothyroxine  200 mcg Oral QAC breakfast  .  pantoprazole  40 mg Oral Daily  . predniSONE  50 mg Oral Q breakfast  . rOPINIRole  2 mg Oral TID   Continuous Infusions: . sodium chloride 50 mL/hr at 05/14/15 1003   PRN Meds:.albuterol Assessment/Plan:  AKI on Superimposed CKD4: Creatinine improved to from 5.24 to 4.67 today with concordant decrease in BUN. She is short of breath today, more likely driven by atelectasis rather than volume overload. She is satting >92% on RA. Nonetheless, we will decrease her fluids today, which will expire today. Her appetite will likely improve as her renal function improves - Hold Lasix - 50 cc/hr, will expire today - BMET in AM - Encourage po intake  Dyspnea: Likely driven by atelectasis - Incentive spiromatery  Gout:  Onset parelells initiation of doubling diuresis regimen. Previous treated as cellulitis with cephalexin 500 mg BID over 10 day course. - Prednisone 40 mg qAC  T2DM with neuropathy: On Levemir 36U at bedtime, Novolog 20U TID at home. Glucose 83. - SSI moderate - Levemir 20U qhs - Gabapentin 300 mg TID  Left Toe Infection: Small area of erythema, unclear antibiotic being used, but is near end of course. - Consider re-initiated abx  Hypothyroidism: Continue 200 mcg daily  HTN: Stable - Clonidine 0.1 mg at bedime - Hydralazine 1000 mg TID - Imdur 120 mg daily - Labetolol 300 mg BID - Holding lasix and metolazone  GERD: Protonix  Parkinson's: Sinemet 50-200mg  TID, Ropinirole 2 mg TID  Asthma: Normal lung exam - Continue Breo, prn albuterol  DVT Prophylaxis: Heparin [place]  Dispo: Disposition is deferred at this time, awaiting improvement of current medical problems.  Anticipated discharge in approximately 1-2 day(s).   The patient does have a current PCP Townsend Roger, MD) and does not need an [facility] hospital follow-up appointment after discharge.  The patient does have transportation limitations that hinder transportation to clinic appointments.  .Services Needed at time of discharge: Y = Yes, Blank = No PT:   OT:   RN:   Equipment:   Other:     LOS: 1 day   Liberty Handy, MD 05/14/2015, 1:11 PM

## 2015-05-14 NOTE — Discharge Instructions (Signed)
Katelyn Dunlap, it was a pleasure taking care of you in the hospital.  You were here because you were dehydrated from the high dose of lasix and it hurt your kidneys. We have reduced the dose of lasix to 80 mg twice daily.   We also found that you have gout, which can be caused by dehydration. We have prescribed a short course of steroids.

## 2015-05-14 NOTE — Progress Notes (Signed)
BP this morning is 162/56.  Notified MD on-call.  Will continue to monitor.

## 2015-05-14 NOTE — Progress Notes (Signed)
  Date: 05/14/2015  Patient name: Katelyn Dunlap  Medical record number: [id number]  Date of birth: 26-Oct-1931   I have seen and evaluated Dorethea Clan and discussed their care with the Residency Team. Briefly, Ms. Tomasino is an 82yo woman who presented for decreased appetite and AKI which was found at her renal physician's office.  Apparently, she had a recent increase in her lasix and since that time she has had normal urine output, decreased appetite and increased weight loss.  She has also noted increased pain in her left great toe in that same time for which she was started on a course of antibiotics.  She has chronic leg swelling for which she uses compression stockings.   SHe further has some DOE and orthopnea.  She is being evaluated for starting dialysis.  In the ED, her Cr was 5.2 which was up from baseline and her BUN was 129.  She was hypertensive.   PMHx, Fam Hx, and/or Soc Hx : ESRD, PD, never smoker  Filed Vitals:   05/14/15 0500 05/14/15 0917  BP: 162/56 160/71  Pulse: 90 90  Temp: 97.7 F (36.5 C) 98.4 F (36.9 C)  Resp: 20 20   Physical exam:  Gen: Lying in bed, alert, NAD Eyes: Anicteric sclerae, normal conjunctivae HENT: dry MM, neck is supple CV: RR, NR, no murmur Pulm: Some crackles that clear with coughing, no wheezing Abd: Soft, NT Ext: Chronic edema, chronic venous stasis skin changes on the left.  She has redness and very localized swelling of the left great toe, Toe is exquisitely tender to palpation.  No streaking or other redness.  No skin breakdown.   Assessment and Plan: I have seen and evaluated the patient as outlined above. I agree with the formulated Assessment and Plan as detailed in the residents' note, with the following changes:   1. AKI on CKD - Likely related to increased diuresis and decreased PO intake - Low rate of fluids, this is a delicate balance, so frequent clinical exams to evaluate for pending volume overload - Hold diuretics for now -  BMET to trend  2. Podagra - Given history, this could be acute gout - Consider treatment with steroids or colchicine as renal function allows - Call pharmacy to find out which antibiotic she was on  She has other chronic issues which are being monitored including DM and mostly managed with her home medications.    Sid Falcon, MD 4/8/201711:41 AM

## 2015-05-15 DIAGNOSIS — N184 Chronic kidney disease, stage 4 (severe): Secondary | ICD-10-CM | POA: Diagnosis not present

## 2015-05-15 DIAGNOSIS — I129 Hypertensive chronic kidney disease with stage 1 through stage 4 chronic kidney disease, or unspecified chronic kidney disease: Secondary | ICD-10-CM | POA: Diagnosis not present

## 2015-05-15 DIAGNOSIS — E1122 Type 2 diabetes mellitus with diabetic chronic kidney disease: Secondary | ICD-10-CM | POA: Diagnosis not present

## 2015-05-15 DIAGNOSIS — N179 Acute kidney failure, unspecified: Secondary | ICD-10-CM | POA: Diagnosis not present

## 2015-05-15 LAB — BASIC METABOLIC PANEL
Anion gap: 18 — ABNORMAL HIGH (ref 5–15)
BUN: 111 mg/dL — AB (ref 6–20)
CHLORIDE: 104 mmol/L (ref 101–111)
CO2: 20 mmol/L — ABNORMAL LOW (ref 22–32)
Calcium: 7.9 mg/dL — ABNORMAL LOW (ref 8.9–10.3)
Creatinine, Ser: 4.4 mg/dL — ABNORMAL HIGH (ref 0.44–1.00)
GFR calc non Af Amer: 9 mL/min — ABNORMAL LOW (ref >60)
GFR, EST AFRICAN AMERICAN: 10 mL/min — AB (ref >60)
Glucose, Bld: 220 mg/dL — ABNORMAL HIGH (ref 65–99)
Potassium: 4 mmol/L (ref 3.5–5.1)
Sodium: 142 mmol/L (ref 135–145)

## 2015-05-15 LAB — CBC
HEMATOCRIT: 29.1 % — AB (ref 36.0–46.0)
HEMOGLOBIN: 8.8 g/dL — AB (ref 12.0–15.0)
MCH: 29.9 pg (ref 26.0–34.0)
MCHC: 30.2 g/dL (ref 30.0–36.0)
MCV: 99 fL (ref 78.0–100.0)
Platelets: 298 10*3/uL (ref 150–400)
RBC: 2.94 MIL/uL — AB (ref 3.87–5.11)
RDW: 16.1 % — AB (ref 11.5–15.5)
WBC: 14.1 10*3/uL — AB (ref 4.0–10.5)

## 2015-05-15 LAB — URINE CULTURE

## 2015-05-15 LAB — GLUCOSE, CAPILLARY
GLUCOSE-CAPILLARY: 243 mg/dL — AB (ref 65–99)
GLUCOSE-CAPILLARY: 427 mg/dL — AB (ref 65–99)
Glucose-Capillary: 188 mg/dL — ABNORMAL HIGH (ref 65–99)
Glucose-Capillary: 357 mg/dL — ABNORMAL HIGH (ref 65–99)

## 2015-05-15 MED ORDER — IPRATROPIUM-ALBUTEROL 0.5-2.5 (3) MG/3ML IN SOLN
3.0000 mL | Freq: Four times a day (QID) | RESPIRATORY_TRACT | Status: DC
  Administered 2015-05-15 – 2015-05-16 (×4): 3 mL via RESPIRATORY_TRACT
  Filled 2015-05-15 (×4): qty 3

## 2015-05-15 MED ORDER — INSULIN DETEMIR 100 UNIT/ML ~~LOC~~ SOLN
25.0000 [IU] | Freq: Every day | SUBCUTANEOUS | Status: DC
Start: 2015-05-15 — End: 2015-05-16
  Administered 2015-05-15: 25 [IU] via SUBCUTANEOUS
  Filled 2015-05-15 (×2): qty 0.25

## 2015-05-15 MED ORDER — SODIUM CHLORIDE 0.9 % IV SOLN
INTRAVENOUS | Status: AC
  Administered 2015-05-15: 17:00:00 via INTRAVENOUS

## 2015-05-15 MED ORDER — IPRATROPIUM-ALBUTEROL 0.5-2.5 (3) MG/3ML IN SOLN
3.0000 mL | Freq: Four times a day (QID) | RESPIRATORY_TRACT | Status: DC | PRN
  Administered 2015-05-15: 3 mL via RESPIRATORY_TRACT
  Filled 2015-05-15: qty 3

## 2015-05-15 MED ORDER — IPRATROPIUM-ALBUTEROL 0.5-2.5 (3) MG/3ML IN SOLN: 3.0000 mL | Freq: Four times a day (QID) | RESPIRATORY_TRACT | Status: DC

## 2015-05-15 MED ORDER — INSULIN ASPART 100 UNIT/ML ~~LOC~~ SOLN
0.0000 [IU] | Freq: Three times a day (TID) | SUBCUTANEOUS | Status: DC
  Administered 2015-05-15: 7 [IU] via SUBCUTANEOUS
  Administered 2015-05-15: 20 [IU] via SUBCUTANEOUS
  Administered 2015-05-16 (×2): 3 [IU] via SUBCUTANEOUS

## 2015-05-15 NOTE — Progress Notes (Signed)
BP 184/68 this morning.  Asymptomatic.  Notified MD on-call.  Will continue to monitor.

## 2015-05-15 NOTE — Progress Notes (Addendum)
Subjective: This morning, she reports improvement in her overall condition and is requesting to go home. I explained to her concerns given her renal function and her respiratory status though she feels she can adequately care for herself at home.   Objective: Vital signs in last 24 hours: Filed Vitals:   05/14/15 2035 05/14/15 2300 05/15/15 0422 05/15/15 0809  BP: 166/75 151/61 184/68 185/76  Pulse: 86 78 75 74  Temp: 98.2 F (36.8 C)  97.5 F (36.4 C) 97.8 F (36.6 C)  TempSrc: Oral  Oral Oral  Resp: 20   19  Height:      Weight:      SpO2: 96% 96% 98% 98%   Weight change: 4 lb (1.814 kg)  Intake/Output Summary (Last 24 hours) at 05/15/15 1118 Last data filed at 05/15/15 1000  Gross per 24 hour  Intake 2422.5 ml  Output   1650 ml  Net  772.5 ml   General: Obese, elderly Caucasian female, sitting up in bed, NAD Cardiovascular: RRR, no m/r/g. Pulmonary: No respiratory distress. Audible wheezing noted prior to auscultation and no crackles noted on auscultation. Abdominal: Obese., Soft, nontender, nondistended Extremities: Red left toe with erythematous swelling overlying the first MTP joint consistent with podagra. Some erythema also noted just around the first toenail though chronic per self-report. Neurological: [place] appropriately. Moving all extremities spontaneously. Psychiatric: Bright affect.   Lab Results: Basic Metabolic Panel:  Recent Labs Lab 05/14/15 0538 05/15/15 0427  NA 142 142  K 3.4* 4.0  CL 105 104  CO2 20* 20*  GLUCOSE 80 220*  BUN 117* 111*  CREATININE 4.67* 4.40*  CALCIUM 7.3* 7.9*   Liver Function Tests:  Recent Labs Lab 05/13/15 1303  AST 39  ALT 10*  ALKPHOS 96  BILITOT 0.5  PROT 6.7  ALBUMIN 2.8*   CBC:  Recent Labs Lab 05/13/15 1303 05/14/15 0538 05/15/15 0427  WBC 15.8* 13.7* 14.1*  NEUTROABS 13.6*  --   --   HGB 8.9* 8.4* 8.8*  HCT 29.8* 27.7* 29.1*  MCV 98.3 98.2 99.0  PLT 278 280 298   CBG:  Recent  Labs Lab 05/14/15 0712 05/14/15 0745 05/14/15 1124 05/14/15 1619 05/14/15 2204 05/15/15 0810  GLUCAP 66 73 196* 325* 295* 188*    Micro Results: No results found for this or any previous visit (from the past 240 hour(s)). Studies/Results: Dg Chest 2 View  05/13/2015  CLINICAL DATA:  Cough and shortness of breath for 1 week EXAM: CHEST  2 VIEW COMPARISON:  None. FINDINGS: Cardiac shadow is mildly enlarged. No focal infiltrate is seen. Mild central vascular congestion is noted without significant edema. No effusion is seen. No bony abnormality is noted. IMPRESSION: Mild central vascular congestion without focal infiltrate or edema. Electronically Signed   By: Inez Catalina M.D.   On: 05/13/2015 13:45   Medications: I have reviewed the patient's current medications. Scheduled Meds: . carbidopa-levodopa  1 tablet Oral TID  . cloNIDine  0.1 mg Oral QHS  . feeding supplement (NEPRO CARB STEADY)  237 mL Oral Q24H  . fluticasone furoate-vilanterol  1 puff Inhalation Daily  . gabapentin  300 mg Oral TID  . heparin  5,000 Units Subcutaneous 3 times per day  . hydrALAZINE  100 mg Oral TID  . insulin aspart  0-15 Units Subcutaneous TID WC  . insulin aspart  0-5 Units Subcutaneous QHS  . insulin detemir  20 Units Subcutaneous QHS  . isosorbide mononitrate  120 mg Oral Daily  .  labetalol  300 mg Oral BID  . levothyroxine  200 mcg Oral QAC breakfast  . pantoprazole  40 mg Oral Daily  . predniSONE  50 mg Oral Q breakfast  . 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 Her IV fluids were to expire yesterday though were still running at bedside. Spoke with Dr. Jonnie Finner who recommending holding diuresis and continuing IV fluids for Crt < 4. - Continue 50 cc/hr x 12 hours  - BMET in AM - Encourage po  intake  COPD Gold Stage I: PFTs 09/30/13 notable for FEV1/FEVC 73%, FEV1 85% of predicted.  - Continue DuoNeb every 6 hours for wheezing - Continue Breo Ellipta  - Continue incentive spirometry and encourage her to use to avoid worsening atelectasis  Gout: Onset parallels initiation of doubling diuresis regimen. Previous treated as cellulitis with cephalexin 500 mg BID over 10 day course. - Prednisone 40 mg qAC [Day 2/5]  T2DM with neuropathy: On Levemir 36U at bedtime, Novolog 20U TID at home. CBGs trending in upper 100s and low 200s on prednisone. - Increase SSI moderate to resistant - Increase Levemir 20U qhs to 25U qhs - Continue Gabapentin 300 mg TID  Left Toe Infection: Small area of erythema, unclear antibiotic being used, but is near end of course. - Consider re-initiated abx  Hypothyroidism: Continue Synthroid 200 mcg daily  HTN: BP initially 150s-160s/50s-70s though 180s/60-70s overnight, possibly from steroid and maybe increased fluid though does not appear volume overloaded on exam. - Clonidine 0.1 mg at bedime - Hydralazine 1000 mg TID - Imdur 120 mg daily - Labetolol 300 mg BID - Holding lasix and metolazone  GERD: Protonix  Parkinson's: Sinemet 50-200mg  TID, Ropinirole 2 mg TID  DVT Prophylaxis: Heparin [place]  Dispo: Disposition is deferred at this time, awaiting improvement of current medical problems.  Anticipated discharge in approximately 1-2 day(s).   The patient does have a current PCP Townsend Roger, MD) and does not need an [facility] hospital follow-up appointment after discharge.  The patient does have transportation limitations that hinder transportation to clinic appointments.  .Services Needed at time of discharge: Y = Yes, Blank = No PT:   OT:   RN:   Equipment:   Other:     LOS: 2 days   Riccardo Dubin, MD 05/15/2015, 11:18 AM

## 2015-05-15 NOTE — Care Management Obs Status (Signed)
[facility] NOTIFICATION   Patient Details  Name: Katelyn Dunlap MRN: [id number] Date of Birth: 1931/10/12   Medicare Observation Status Notification Given:  Yes  MOON/C44  given and signed by pt;copy given to pt, original to CMA to be scanned into medical chart.  Dellie Catholic, RN 05/15/2015, 2:35 PM

## 2015-05-15 NOTE — Progress Notes (Signed)
  Date: 05/15/2015  Patient name: Katelyn Dunlap  Medical record number: [id number]  Date of birth: 12-01-1931   This patient has been seen and the plan of care was discussed with the house staff. Please see their note for complete details. I concur with their findings.  Sid Falcon, MD 05/15/2015, 9:25 PM

## 2015-05-16 DIAGNOSIS — E860 Dehydration: Secondary | ICD-10-CM | POA: Diagnosis not present

## 2015-05-16 DIAGNOSIS — K219 Gastro-esophageal reflux disease without esophagitis: Secondary | ICD-10-CM

## 2015-05-16 DIAGNOSIS — E039 Hypothyroidism, unspecified: Secondary | ICD-10-CM

## 2015-05-16 DIAGNOSIS — N179 Acute kidney failure, unspecified: Secondary | ICD-10-CM

## 2015-05-16 DIAGNOSIS — M10071 Idiopathic gout, right ankle and foot: Secondary | ICD-10-CM

## 2015-05-16 DIAGNOSIS — M109 Gout, unspecified: Secondary | ICD-10-CM | POA: Insufficient documentation

## 2015-05-16 DIAGNOSIS — E1140 Type 2 diabetes mellitus with diabetic neuropathy, unspecified: Secondary | ICD-10-CM

## 2015-05-16 DIAGNOSIS — J449 Chronic obstructive pulmonary disease, unspecified: Secondary | ICD-10-CM

## 2015-05-16 DIAGNOSIS — I129 Hypertensive chronic kidney disease with stage 1 through stage 4 chronic kidney disease, or unspecified chronic kidney disease: Secondary | ICD-10-CM | POA: Diagnosis not present

## 2015-05-16 DIAGNOSIS — N184 Chronic kidney disease, stage 4 (severe): Secondary | ICD-10-CM

## 2015-05-16 DIAGNOSIS — G20 Parkinson's disease: Secondary | ICD-10-CM

## 2015-05-16 DIAGNOSIS — Z9981 Dependence on supplemental oxygen: Secondary | ICD-10-CM

## 2015-05-16 DIAGNOSIS — E1122 Type 2 diabetes mellitus with diabetic chronic kidney disease: Secondary | ICD-10-CM

## 2015-05-16 LAB — CBC
HEMATOCRIT: 29.4 % — AB (ref 36.0–46.0)
HEMOGLOBIN: 9.1 g/dL — AB (ref 12.0–15.0)
MCH: 31 pg (ref 26.0–34.0)
MCHC: 31 g/dL (ref 30.0–36.0)
MCV: 100 fL (ref 78.0–100.0)
Platelets: 310 10*3/uL (ref 150–400)
RBC: 2.94 MIL/uL — AB (ref 3.87–5.11)
RDW: 16 % — AB (ref 11.5–15.5)
WBC: 17.5 10*3/uL — AB (ref 4.0–10.5)

## 2015-05-16 LAB — BASIC METABOLIC PANEL
ANION GAP: 17 — AB (ref 5–15)
BUN: 106 mg/dL — ABNORMAL HIGH (ref 6–20)
CHLORIDE: 107 mmol/L (ref 101–111)
CO2: 20 mmol/L — ABNORMAL LOW (ref 22–32)
Calcium: 8.3 mg/dL — ABNORMAL LOW (ref 8.9–10.3)
Creatinine, Ser: 3.85 mg/dL — ABNORMAL HIGH (ref 0.44–1.00)
GFR, EST AFRICAN AMERICAN: 12 mL/min — AB (ref >60)
GFR, EST NON AFRICAN AMERICAN: 10 mL/min — AB (ref >60)
Glucose, Bld: 185 mg/dL — ABNORMAL HIGH (ref 65–99)
POTASSIUM: 3.8 mmol/L (ref 3.5–5.1)
SODIUM: 144 mmol/L (ref 135–145)

## 2015-05-16 LAB — GLUCOSE, CAPILLARY
GLUCOSE-CAPILLARY: 123 mg/dL — AB (ref 65–99)
Glucose-Capillary: 140 mg/dL — ABNORMAL HIGH (ref 65–99)

## 2015-05-16 MED ORDER — PREDNISONE 20 MG PO TABS
20.0000 mg | ORAL_TABLET | Freq: Every day | ORAL | Status: DC
Start: 2015-05-17 — End: 2015-05-16

## 2015-05-16 MED ORDER — PREDNISONE 20 MG PO TABS: 20.0000 mg | ORAL_TABLET | Freq: Every day | ORAL | Status: AC

## 2015-05-16 NOTE — Progress Notes (Signed)
Inpatient Diabetes Program Recommendations  AACE/ADA: New Consensus Statement on Inpatient Glycemic Control (2015)  Target Ranges:  Prepandial:   less than 140 mg/dL      Peak postprandial:   less than 180 mg/dL (1-2 hours)      Critically ill patients:  140 - 180 mg/dL   Results for Katelyn Dunlap, Katelyn Dunlap (MRN [id number]) as of 05/16/2015 10:48  Ref. Range 05/15/2015 08:10 05/15/2015 11:43 05/15/2015 16:49 05/15/2015 21:16 05/16/2015 08:09  Glucose-Capillary Latest Ref Range: 65-99 mg/dL 188 (H) 243 (H) 357 (H) 427 (H) 140 (H)   Review of Glycemic Control  Diabetes history: DM 2 Outpatient Diabetes medications: Levemir 36 units, Novolog 20 units TID Current orders for Inpatient glycemic control: Levemir 25 units QHS, Novolog Resistant TID  Inpatient Diabetes Program Recommendations: Insulin - Meal Coverage: Increased glucose to 400's due to steroids. Patient takes meal coverage at home. Please consider starting Novolog 5 units TID meal coverage if patient consumes at least 50% of meals.   Thanks,  Tama Headings RN, MSN, [facility] Inpatient Diabetes Coordinator Team Pager [phone] (8a-5p)

## 2015-05-16 NOTE — Progress Notes (Signed)
Patient blood sugar tonight 427.Dr.Kennedy notified no new orders received.Patient to receive 25 units of levemir as scheduled.

## 2015-05-16 NOTE — Progress Notes (Signed)
Patient becoming more and more confused as night goes on.Patient having visual hallucinations of room being on fire.Patient also believes some men are in her room.Attempting to reorient patient unsuccessfully. This nurse called patient husband Francee Piccolo and asked him if patient gets confused at home.Patient husband stated,"This behavior is unusual for her.I don't know what's going on.TV turned off to decrease stimulation and called Dr. Merrilyn Puma to come assess patient.

## 2015-05-16 NOTE — Progress Notes (Signed)
Subjective: Patient had episode of hallucinosis last night that has resolved. Patient has no complaints today, other than her persistent left toe pain.  Objective: Vital signs in last 24 hours: Filed Vitals:   05/16/15 0641 05/16/15 0819 05/16/15 0925 05/16/15 1059  BP: 166/90  188/70   Pulse: 72  77   Temp: 97.5 F (36.4 C)  98 F (36.7 C)   TempSrc: Oral  Oral   Resp: 16  18   Height:      Weight:      SpO2: 98% 98% 95% 96%   Weight change:   Intake/Output Summary (Last 24 hours) at 05/16/15 1158 Last data filed at 05/16/15 0900  Gross per 24 hour  Intake   1320 ml  Output   1900 ml  Net   -580 ml   General: Obese, elderly Caucasian female, sitting up in bed, NAD Cardiovascular: RRR, no m/r/g. Pulmonary: No respiratory distress. Audible wheezing noted prior to auscultation and no crackles noted on auscultation. Abdominal: Obese., Soft, nontender, nondistended Extremities: Red left toe with erythematous swelling overlying the first MTP joint.  Neurological: Converses appropriately. Moving all extremities spontaneously. Psychiatric: Bright affect.   Lab Results: Basic Metabolic Panel:  Recent Labs Lab 05/15/15 0427 05/16/15 0801  NA 142 144  K 4.0 3.8  CL 104 107  CO2 20* 20*  GLUCOSE 220* 185*  BUN 111* 106*  CREATININE 4.40* 3.85*  CALCIUM 7.9* 8.3*   Liver Function Tests:  Recent Labs Lab 05/13/15 1303  AST 39  ALT 10*  ALKPHOS 96  BILITOT 0.5  PROT 6.7  ALBUMIN 2.8*   CBC:  Recent Labs Lab 05/13/15 1303  05/15/15 0427 05/16/15 0801  WBC 15.8*  < > 14.1* 17.5*  NEUTROABS 13.6*  --   --   --   HGB 8.9*  < > 8.8* 9.1*  HCT 29.8*  < > 29.1* 29.4*  MCV 98.3  < > 99.0 100.0  PLT 278  < > 298 310  < > = values in this interval not displayed. CBG:  Recent Labs Lab 05/14/15 2204 05/15/15 0810 05/15/15 1143 05/15/15 1649 05/15/15 2116 05/16/15 0809  GLUCAP 295* 188* 243* 357* 427* 140*     Studies/Results: No results  found. Medications: I have reviewed the patient's current medications. Scheduled Meds: . carbidopa-levodopa  1 tablet Oral TID  . cloNIDine  0.1 mg Oral QHS  . feeding supplement (NEPRO CARB STEADY)  237 mL Oral Q24H  . fluticasone furoate-vilanterol  1 puff Inhalation Daily  . gabapentin  300 mg Oral TID  . heparin  5,000 Units Subcutaneous 3 times per day  . hydrALAZINE  100 mg Oral TID  . insulin aspart  0-20 Units Subcutaneous TID WC  . insulin detemir  25 Units Subcutaneous QHS  . ipratropium-albuterol  3 mL Nebulization Q6H  . isosorbide mononitrate  120 mg Oral Daily  . labetalol  300 mg Oral BID  . levothyroxine  200 mcg Oral QAC breakfast  . pantoprazole  40 mg Oral Daily  . predniSONE  50 mg Oral Q breakfast  . rOPINIRole  2 mg Oral TID   Continuous Infusions:   PRN Meds:. 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 Dr. Jonnie Finner, nephrologist, recommended goal of Crt < 4. - Will resume 80 mg lasix BID on discharge, the dose she was on prior to the latest increase - D/C fluids - Encourage po intake  COPD Gold  Stage I: PFTs 09/30/13 notable for FEV1/FEVC 73%, FEV1 85% of predicted. Patient uses O2 at night only. She is satting 96% on room air this morning - Continue DuoNeb every 6 hours for wheezing - Continue Breo Ellipta  - Continue incentive spirometry and encourage her to use to avoid worsening atelectasis  Gout: Onset parallels initiation of doubling diuresis regimen. Prednisone may have contributed to hallucinosis, so we will decrease the dose - Prednisone 20 mg qAC [place] 3/5]  T2DM with neuropathy: On Levemir 36U at bedtime, Novolog 20U TID at home. CBGs in 100s today on regimen below - Resistant SSI - Levemir  25U qhs - Continue Gabapentin 300 mg TID  Hypothyroidism: Continue Synthroid 200 mcg daily  HTN: BP elevated 188/70 today - Clonidine 0.1 mg at bedime - Hydralazine 1000 mg TID -  Imdur 120 mg daily - Labetolol 300 mg BID - Holding lasix and metolazone  GERD: Protonix  Parkinson's: Sinemet 50-200mg  TID, Ropinirole 2 mg TID  DVT Prophylaxis: Heparin [place]  Dispo: Anticipated discharge home today.  The patient does have a current PCP Townsend Roger, MD) and does not need an [facility] hospital follow-up appointment after discharge.  The patient does have transportation limitations that hinder transportation to clinic appointments.  .Services Needed at time of discharge: Y = Yes, Blank = No PT:   OT:   RN:   Equipment:   Other:     LOS: 3 days   Liberty Handy, MD 05/16/2015, 11:58 AM

## 2015-05-16 NOTE — Progress Notes (Signed)
Patient given incentive spirometer and instructed on how to use.  Patient returned demonstration.

## 2015-05-16 NOTE — Progress Notes (Signed)
Discharge instructions and medications discussed with patient & husband.  All questions answered.

## 2015-05-16 NOTE — Discharge Summary (Signed)
Name: Katelyn Dunlap MRN: [id number] DOB: 1931/05/06 82 y.o. PCP: Townsend Roger, MD  Date of Admission: 05/13/2015  2:13 PM Date of Discharge: 05/16/2015 Attending Physician: Annia Belt, MD  Discharge Diagnosis: 1.  Acute renal failure superimposed on stage 4 chronic kidney disease ([place]) 2. COPD Gold Stage I 3. Gout 4. T2DM with Neuropathy 5. Hypothyroidism 6. Hypertension 7. GERD 8. Parkinson's Disease      Discharge Medications:   Medication List    STOP taking these medications        acetaminophen 650 MG CR tablet  Commonly known as:  TYLENOL     aspirin 81 MG tablet     carbidopa-levodopa 25-100 MG tablet  Commonly known as:  SINEMET IR  Replaced by:  carbidopa-levodopa 50-200 MG tablet     carbidopa-levodopa 25-250 MG tablet  Commonly known as:  SINEMET IR     cephALEXin 500 MG capsule  Commonly known as:  KEFLEX     cyclobenzaprine 5 MG tablet  Commonly known as:  FLEXERIL     METAMUCIL PO     methocarbamol 750 MG tablet  Commonly known as:  ROBAXIN     multivitamin tablet     multivitamin with minerals Tabs tablet     omeprazole 20 MG capsule  Commonly known as:  PRILOSEC     rOPINIRole 1 MG tablet  Commonly known as:  REQUIP     rOPINIRole 2 MG tablet  Commonly known as:  REQUIP     tiZANidine 4 MG capsule  Commonly known as:  ZANAFLEX      TAKE these medications        albuterol 108 (90 Base) MCG/ACT inhaler  Commonly known as:  PROVENTIL HFA;VENTOLIN HFA  Inhale 1-2 puffs into the lungs every 6 (six) hours as needed for wheezing or shortness of breath.     atorvastatin 80 MG tablet  Commonly known as:  LIPITOR  Take 80 mg by mouth at bedtime.     beta carotene w/minerals tablet  Take 1 tablet by mouth daily.     BREO ELLIPTA 200-25 MCG/INH Aepb  Generic drug:  fluticasone furoate-vilanterol  INHALE 1 PUFF INTO THE LUNGS DAILY     calcitRIOL 0.25 MCG capsule  Commonly known as:  ROCALTROL  Take 0.5 mcg by mouth  every Monday, Wednesday, and Friday.     carbidopa-levodopa 50-200 MG tablet  Commonly known as:  SINEMET CR  Take 1 tablet by mouth 3 (three) times daily.     cetirizine 10 MG tablet  Commonly known as:  ZYRTEC  Take 10 mg by mouth daily.     cloNIDine 0.1 MG tablet  Commonly known as:  CATAPRES  Take 1 tablet (0.1 mg total) by mouth at bedtime.     epoetin alfa 10000 UNIT/ML injection  Commonly known as:  EPOGEN,PROCRIT  Inject 10,000 Units into the skin every 14 (fourteen) days. Last injection 05/09/15     FIBER THERAPY PO  Take 1 tablet by mouth 2 (two) times daily.     furosemide 80 MG tablet  Commonly known as:  LASIX  Take 1 tablet (80 mg total) by mouth 2 (two) times daily at 10 am and 4 pm. And second dose if needed     gabapentin 300 MG capsule  Commonly known as:  NEURONTIN  Take 300 mg by mouth 3 (three) times daily.     Glucosamine-Chondroitin 250-200 MG Caps  Take 1 tablet by mouth 2 (two) times  daily. Reported on 05/14/2015     hydrALAZINE 100 MG tablet  Commonly known as:  APRESOLINE  Take 100 mg by mouth 3 (three) times daily.     HYDROcodone-acetaminophen 5-325 MG tablet  Commonly known as:  NORCO/VICODIN  Take 1 tablet by mouth 2 (two) times daily as needed for moderate pain.     isosorbide mononitrate 120 MG 24 hr tablet  Commonly known as:  IMDUR  Take 120 mg by mouth daily.     labetalol 300 MG tablet  Commonly known as:  NORMODYNE  Take 300 mg by mouth 2 (two) times daily.     LEVEMIR FLEXPEN 100 UNIT/ML Pen  Generic drug:  Insulin Detemir  Inject 36 Units into the skin at bedtime.     levothyroxine 200 MCG tablet  Commonly known as:  SYNTHROID, LEVOTHROID  Take 200 mcg by mouth daily before breakfast.     metolazone 5 MG tablet  Commonly known as:  ZAROXOLYN  Take 5 mg by mouth every other day.     mineral/vitamin supplement 70 MG Tabs tablet  Take 70 mg by mouth daily.     mometasone 50 MCG/ACT nasal spray  Commonly known as:   NASONEX  Place 2 sprays into both nostrils daily.     NOVOLOG FLEXPEN 100 UNIT/ML FlexPen  Generic drug:  insulin aspart  Inject 20 Units into the skin 3 (three) times daily with meals.     predniSONE 20 MG tablet  Commonly known as:  DELTASONE  Take 1 tablet (20 mg total) by mouth daily with breakfast.  Start taking on:  05/17/2015     SLOW FE 142 (45 Fe) MG Tbcr  Generic drug:  Ferrous Sulfate  Take 142 mg by mouth daily.     valsartan 160 MG tablet  Commonly known as:  DIOVAN  Take 160 mg by mouth at bedtime.      ASK your doctor about these medications        fluticasone 50 MCG/ACT nasal spray  Commonly known as:  FLONASE  Place 1 spray into both nostrils daily as needed for allergies or rhinitis.     folic acid 1 MG tablet  Commonly known as:  FOLVITE  Take 1 mg by mouth at bedtime.  Ask about: Which instructions should I use?     ipratropium-albuterol 0.5-2.5 (3) MG/3ML Soln  Commonly known as:  DUONEB  Inhale 3 mLs into the lungs 4 (four) times daily as needed (shortness of breath/ wheezing).        Disposition and follow-up:   Katelyn Dunlap was discharged from [facility] in stable condition.  At the hospital follow up visit please address:  1.  Patient admitted for dehydration secondary to over-diuresis leading to an AKI superimposed on her CKD Stage 4. She was given gentle hydration and her renal function improved toward her baseline. Please reassess renal function.  2. Patient complaining of dyspnea with normal SpO2 off oxygen, likely due to atelectasis. She remained afebrile during the admission. She was given an incentive spirometer and educated on how to use it. Please assess respiratory status.  2.  Labs / imaging needed at time of follow-up: Renal Function Panel  3.  Pending labs/ test needing follow-up: None  Follow-up Appointments: Follow-up Information    Follow up with Sherril Croon, MD. Go on 05/19/2015.   Specialty:   Nephrology   Why:  [id number]   Contact information:   [address] [place] [address]  [phone]       Discharge Instructions: Discharge Instructions    Diet - low sodium heart healthy    Complete by:  As directed      Increase activity slowly    Complete by:  As directed           Katelyn Dunlap, it was a pleasure taking care of you in the hospital.  You were here because you were dehydrated from the high dose of lasix and it hurt your kidneys. We have reduced the dose of lasix to 80 mg twice daily.   We also found that you have gout, which can be caused by dehydration. We have prescribed a short course of steroids.   Procedures Performed:  Dg Chest 2 View  05/13/2015  CLINICAL DATA:  Cough and shortness of breath for 1 week EXAM: CHEST  2 VIEW COMPARISON:  None. FINDINGS: Cardiac shadow is mildly enlarged. No focal infiltrate is seen. Mild central vascular congestion is noted without significant edema. No effusion is seen. No bony abnormality is noted. IMPRESSION: Mild central vascular congestion without focal infiltrate or edema. Electronically Signed   By: Inez Catalina M.D.   On: 05/13/2015 13:45     Admission HPI:  Katelyn Dunlap is an 82 yo woman with PMH of Parkinson's, CKD stage 4, asthma, T2DM, dCHF who presents with decreased appetite and an AKI detected by her nephrologist. The patient, who also takes metolazone every other day, recently had her furosemide increased from 80 mg BID to 160 mg BID. In the time, she has gone from 194 lbs to 180 lbs. She denies any increased urinary frequency, going about four times daily. Since this change, she has also been eating very little. She complains of some left big toe pain for which she was given antibiotics. She cannot remember the antibiotic she was given and she reports that it is not appreciably improving - she has two more antibiotic pills left. She indicates she would get leg swelling, but this is held at bay by stockings. She  endorses some dyspnea on exertion and orthopnea. She denies any chest pain, shortness of breath at rest, nausea, vomiting, diarrhea, constipation, dysuria, blackouts, new one sided weakness, thirstiness. There are plans place a fistula later this month for dialysis. The patient needs help at home bathing, but can feed herself. Her husband assists with some ADLs. She likes to read and go to the beauty shop for fun. She does not smoke or drink alcohol. In the ED, BUN 129 Cr 5.2, unclear baseline - patient does not know. She was mildly hypertensive at 158/64.   Hospital Course by problem list:   Acute renal failure superimposed on stage 4 chronic kidney disease: Patient was initially started on 100 cc/hr NS and her furosemide was held. On the following day, the rate of infusion was decreased to 50 cc/hr, which continued until 4/9. Her creatinine expectedly decreased from 5.24 to 3.85 by day of discharge with a goal of <4. Discharge exam noted that she had moist mucous membranes. She was discharged with close follow up with [facility]. A reduced dose of furosemide 80 mg BID po was initiated on discharge.   COPD Gold Stage I: Patient noted to have mild wheezing on day of discharge with diminished respiratory excursions; nonetheless, she was consistently satting >96% on room air. She did not have any fevers during this hospitalization. Atelectasis may have been playing a role, and she was discharged with an incentive spirometer with  education on how to use it. She was continued on home DuoNeb every 6 hours for wheezing, Breo Ellipta .   Gout: On day of admission, patient was noted to have erythematous painful left first toe consistent with podagra. Given her renal function and diminished appetite, she was started on prednisone 50 mg daily. However, on the evening of 4/9, the patient had a brief episode of hallucinosis and her prednisone dose was subsequently decreased to 20 mg daily. On day of  discharge, it was noted that the erythema had improved. She was discharged with two additional doses of prednisone.  T2DM with Neuropathy: Patient maintained on escalating doses of Levemir and sliding scale insuline. With 25U Levemir and resistant SSI, CBGs were in the 100s. She was discharged on her home regimen.   Hypertension: Continued on home Clonidine 0.1 mg at bedtime, Hydralazine 1000 mg TID, Imdur 120 mg daily, Labetolol 300 mg BID. Furosemide and metolazone were held and resumed on discharge.   Discharge Vitals:   BP 188/70 mmHg  Pulse 77  Temp(Src) 98 F (36.7 C) (Oral)  Resp 18  Ht 5' (1.524 m)  Wt 186 lb (84.369 kg)  BMI 36.33 kg/m2  SpO2 92%  Discharge Labs:  Results for orders placed or performed during the hospital encounter of 05/13/15 (from the past 24 hour(s))  Glucose, capillary     Status: Abnormal   Collection Time: 05/15/15  4:49 PM  Result Value Ref Range   Glucose-Capillary 357 (H) 65 - 99 mg/dL  Glucose, capillary     Status: Abnormal   Collection Time: 05/15/15  9:16 PM  Result Value Ref Range   Glucose-Capillary 427 (H) 65 - 99 mg/dL  Basic metabolic panel     Status: Abnormal   Collection Time: 05/16/15  8:01 AM  Result Value Ref Range   Sodium 144 135 - 145 mmol/L   Potassium 3.8 3.5 - 5.1 mmol/L   Chloride 107 101 - 111 mmol/L   CO2 20 (L) 22 - 32 mmol/L   Glucose, Bld 185 (H) 65 - 99 mg/dL   BUN 106 (H) 6 - 20 mg/dL   Creatinine, Ser 3.85 (H) 0.44 - 1.00 mg/dL   Calcium 8.3 (L) 8.9 - 10.3 mg/dL   GFR calc non Af Amer 10 (L) >60 mL/min   GFR calc Af Amer 12 (L) >60 mL/min   Anion gap 17 (H) 5 - 15  CBC     Status: Abnormal   Collection Time: 05/16/15  8:01 AM  Result Value Ref Range   WBC 17.5 (H) 4.0 - 10.5 K/uL   RBC 2.94 (L) 3.87 - 5.11 MIL/uL   Hemoglobin 9.1 (L) 12.0 - 15.0 g/dL   HCT 29.4 (L) 36.0 - 46.0 %   MCV 100.0 78.0 - 100.0 fL   MCH 31.0 26.0 - 34.0 pg   MCHC 31.0 30.0 - 36.0 g/dL   RDW 16.0 (H) 11.5 - 15.5 %   Platelets  310 150 - 400 K/uL  Glucose, capillary     Status: Abnormal   Collection Time: 05/16/15  8:09 AM  Result Value Ref Range   Glucose-Capillary 140 (H) 65 - 99 mg/dL  Glucose, capillary     Status: Abnormal   Collection Time: 05/16/15 11:57 AM  Result Value Ref Range   Glucose-Capillary 123 (H) 65 - 99 mg/dL    Signed: Liberty Handy, MD 05/16/2015, 2:37 PM   Equipment Ordered on Discharge: Incentive Spirometer

## 2015-05-26 ENCOUNTER — Encounter (HOSPITAL_COMMUNITY): Payer: Self-pay | Admitting: *Deleted

## 2015-05-26 MED ORDER — SODIUM CHLORIDE 0.9 % IV SOLN
INTRAVENOUS | Status: DC
Start: 2015-05-27 — End: 2015-05-27

## 2015-05-26 MED ORDER — VANCOMYCIN HCL 10 G IV SOLR
1500.0000 mg | INTRAVENOUS | Status: DC
  Filled 2015-05-26: qty 1500

## 2015-05-26 NOTE — Progress Notes (Signed)
Pt SDW -Pre-op call completed by pt, spouse, Francee Piccolo [facility]) and sister, Raquel Sarna, with pt consent. Pt is a poor historian; cardiac records requested from Dr. Jimmie Molly, Cardiologist. Pt denies SOB and chest pain. Pt sister given pre-op instructions and made aware that pt should take on 18 units of Levemir insulin tonight and no Novolog Insulin on DOS. Sister also made aware that pt should stop vitamins, fish oil, NSAID's, herbal medications and Glucosamine. Pt stated that she is no longer on Prednisone. Both sister and pt made aware to bring inhaler in DOS. Pt sister made aware of diabetes protocol to check BS every 2 hours prior to procedure, interventions for BS < 70 and > 220 and phone # to SS. Sister verbalized understanding of all pre-op instructions.

## 2015-05-26 NOTE — Progress Notes (Signed)
Anesthesia Chart Review: SAME DAY WORK-UP.  Patient is a 82 year old female scheduled for 82 year old posted left brachiocephalic AVF on [id number] by Dr. Kellie Simmering. Procedure is posted for MAC anesthesia.   History includes CKD stage IV, non-smoker, asthma, pulmonary hypertension (RVSP 32 mmHg '15), home O2 (nocturnal), diastolic dysfunction, GERD, HTN, HLD, Parkinson's disease, Graves' disease, hypothyroidism,  DM2, skin cancer, appendectomy, cholecystectomy, hysterectomy. Hospitalized 05/13/15-05/16/15 for dehydration (secondary to over-diuresis) with acute on chronic kidney disease.  PCP is Dr. Vassie Loll Eyk. Nephrologist is Dr. Justin Mend. Cardiologist is Dr. Danie Binder. He last saw patient on 04/14/15 and mentions plans for AVF creation soon. Pulmonologist is Dr. Asencion Noble.  Meds include albuterol, Lipitor, Breo Ellipta, Zyrtec, clonidine, slow Fe, Flonase, folic acid, Lasix, Neurontin, hydralazine, Norco, Novolog, Levemir, Duoneb, Imdur, labetalol, levothyroxine, metolazone, valsartan, prednisone, Nasonex.  09/04/13 Echo [facility]): Conclusions: 1. This was a technically difficult study with suboptimal views. 2. Overall left ventricular systolic function is normal with EF between 60-65%. 3. The diastolic filling pattern indicates impaired relaxation. 4. There is trace to mild mitral regurgitation. 5. Mild tricuspid regurgitation present. 6. The RVSP as measured by Doppler is 32 mmHg.  09/04/13 Spirometry [facility]): FVC 1.85 (86%), FEV1 1.34 (85%).   Labs from 05/16/15 noted (done on the morning of her hospital discharge). Cr 3.85. K 3.8. Glucose 185. H/H 9.1/29.4. Her WBC was elevated at 17.5K (up from 14.1 on 05/15/15). Will order a CBC along with the ISTAT that is already ordered.   Currently, I do not have an EKG tracing. Last tracing requested from Dr. Andrey Campanile office but records are still pending. If one not received within the past year then she will need one done on  arrival.   She is a same day work-up, so further review of her labs, EKG and patient evaluation on the day of surgery. Definitive anesthesia plan to be determined at that time.  George Hugh [facility] Short Stay Center/Anesthesiology Phone [phone] 05/26/2015 1:34 PM

## 2015-05-27 ENCOUNTER — Encounter (HOSPITAL_COMMUNITY)
Admission: RE | Disposition: A | Discharge status: Home / Self Care | Payer: Self-pay | Source: Ambulatory Visit | Attending: Surgery

## 2015-05-27 ENCOUNTER — Ambulatory Visit (HOSPITAL_COMMUNITY): Payer: Medicare Other | Admitting: Vascular Surgery

## 2015-05-27 ENCOUNTER — Encounter (HOSPITAL_COMMUNITY): Payer: Self-pay | Admitting: *Deleted

## 2015-05-27 ENCOUNTER — Ambulatory Visit (HOSPITAL_COMMUNITY)
Admission: RE | Admit: 2015-05-27 | Discharge: 2015-05-27 | Disposition: A | Discharge status: Home / Self Care | Payer: Medicare Other | Source: Ambulatory Visit | Attending: Surgery | Admitting: Surgery

## 2015-05-27 ENCOUNTER — Other Ambulatory Visit: Payer: Self-pay | Admitting: *Deleted

## 2015-05-27 DIAGNOSIS — G20 Parkinson's disease: Secondary | ICD-10-CM | POA: Diagnosis not present

## 2015-05-27 DIAGNOSIS — E785 Hyperlipidemia, unspecified: Secondary | ICD-10-CM | POA: Insufficient documentation

## 2015-05-27 DIAGNOSIS — I120 Hypertensive chronic kidney disease with stage 5 chronic kidney disease or end stage renal disease: Secondary | ICD-10-CM | POA: Diagnosis present

## 2015-05-27 DIAGNOSIS — I129 Hypertensive chronic kidney disease with stage 1 through stage 4 chronic kidney disease, or unspecified chronic kidney disease: Secondary | ICD-10-CM | POA: Diagnosis not present

## 2015-05-27 DIAGNOSIS — N186 End stage renal disease: Secondary | ICD-10-CM

## 2015-05-27 DIAGNOSIS — Z794 Long term (current) use of insulin: Secondary | ICD-10-CM | POA: Insufficient documentation

## 2015-05-27 DIAGNOSIS — E1122 Type 2 diabetes mellitus with diabetic chronic kidney disease: Secondary | ICD-10-CM | POA: Diagnosis not present

## 2015-05-27 DIAGNOSIS — Z880 Allergy status to penicillin: Secondary | ICD-10-CM | POA: Insufficient documentation

## 2015-05-27 DIAGNOSIS — N184 Chronic kidney disease, stage 4 (severe): Secondary | ICD-10-CM | POA: Diagnosis not present

## 2015-05-27 DIAGNOSIS — Z4931 Encounter for adequacy testing for hemodialysis: Secondary | ICD-10-CM

## 2015-05-27 DIAGNOSIS — Z7982 Long term (current) use of aspirin: Secondary | ICD-10-CM | POA: Insufficient documentation

## 2015-05-27 HISTORY — PX: AV FISTULA PLACEMENT: SHX1204

## 2015-05-27 HISTORY — DX: Presence of spectacles and contact lenses: Z97.3

## 2015-05-27 LAB — GLUCOSE, CAPILLARY: Glucose-Capillary: 246 mg/dL — ABNORMAL HIGH (ref 65–99)

## 2015-05-27 LAB — POCT I-STAT 4, (NA,K, GLUC, HGB,HCT)
Glucose, Bld: 252 mg/dL — ABNORMAL HIGH (ref 65–99)
HCT: 29 % — ABNORMAL LOW (ref 36.0–46.0)
Hemoglobin: 9.9 g/dL — ABNORMAL LOW (ref 12.0–15.0)
Potassium: 4.4 mmol/L (ref 3.5–5.1)
Sodium: 142 mmol/L (ref 135–145)

## 2015-05-27 SURGERY — ARTERIOVENOUS (AV) FISTULA CREATION
Anesthesia: Monitor Anesthesia Care | Site: Arm Upper | Laterality: Left

## 2015-05-27 MED ORDER — OXYCODONE HCL 5 MG/5ML PO SOLN: 5.0000 mg | Freq: Once | ORAL | Status: DC | PRN

## 2015-05-27 MED ORDER — FENTANYL CITRATE (PF) 100 MCG/2ML IJ SOLN: 25.0000 ug | INTRAMUSCULAR | Status: DC | PRN

## 2015-05-27 MED ORDER — LIDOCAINE-EPINEPHRINE (PF) 1 %-1:200000 IJ SOLN
INTRAMUSCULAR | Status: AC
  Filled 2015-05-27: qty 30

## 2015-05-27 MED ORDER — CHLORHEXIDINE GLUCONATE CLOTH 2 % EX PADS: 6.0000 | MEDICATED_PAD | Freq: Once | CUTANEOUS | Status: DC

## 2015-05-27 MED ORDER — FENTANYL CITRATE (PF) 250 MCG/5ML IJ SOLN
INTRAMUSCULAR | Status: DC | PRN
  Administered 2015-05-27 (×4): 25 ug via INTRAVENOUS

## 2015-05-27 MED ORDER — 0.9 % SODIUM CHLORIDE (POUR BTL) OPTIME
TOPICAL | Status: DC | PRN
  Administered 2015-05-27: 1000 mL

## 2015-05-27 MED ORDER — SODIUM CHLORIDE 0.9 % IV SOLN
INTRAVENOUS | Status: DC
  Administered 2015-05-27: 11:00:00 via INTRAVENOUS

## 2015-05-27 MED ORDER — OXYCODONE HCL 5 MG PO TABS: 5.0000 mg | ORAL_TABLET | Freq: Once | ORAL | Status: DC | PRN

## 2015-05-27 MED ORDER — LIDOCAINE-EPINEPHRINE (PF) 1 %-1:200000 IJ SOLN
INTRAMUSCULAR | Status: DC | PRN
Start: 2015-05-27 — End: 2015-05-27
  Administered 2015-05-27: 19 mL

## 2015-05-27 MED ORDER — MIDAZOLAM HCL 5 MG/5ML IJ SOLN
INTRAMUSCULAR | Status: DC | PRN
  Administered 2015-05-27: 1 mg via INTRAVENOUS
  Administered 2015-05-27 (×2): .5 mg via INTRAVENOUS

## 2015-05-27 MED ORDER — FENTANYL CITRATE (PF) 250 MCG/5ML IJ SOLN
INTRAMUSCULAR | Status: AC
  Filled 2015-05-27: qty 5

## 2015-05-27 MED ORDER — MIDAZOLAM HCL 2 MG/2ML IJ SOLN
INTRAMUSCULAR | Status: AC
  Filled 2015-05-27: qty 2

## 2015-05-27 MED ORDER — SODIUM CHLORIDE 0.9 % IV SOLN
INTRAVENOUS | Status: DC | PRN
  Administered 2015-05-27: 500 mL

## 2015-05-27 MED ORDER — VANCOMYCIN HCL IN DEXTROSE 1-5 GM/200ML-% IV SOLN
1000.0000 mg | INTRAVENOUS | Status: AC
  Administered 2015-05-27: 1000 mg via INTRAVENOUS
  Filled 2015-05-27: qty 200

## 2015-05-27 MED ORDER — HYDROCODONE-ACETAMINOPHEN 5-325 MG PO TABS: 1.0000 | ORAL_TABLET | Freq: Four times a day (QID) | ORAL | Status: AC | PRN

## 2015-05-27 MED ORDER — PROPOFOL 500 MG/50ML IV EMUL
INTRAVENOUS | Status: DC | PRN
  Administered 2015-05-27: 25 ug/kg/min via INTRAVENOUS

## 2015-05-27 SURGICAL SUPPLY — 32 items
ARMBAND PINK RESTRICT EXTREMIT (MISCELLANEOUS) ×3 IMPLANT
CANISTER SUCTION 2500CC (MISCELLANEOUS) ×3 IMPLANT
CLIP TI MEDIUM 6 (CLIP) ×3 IMPLANT
CLIP TI WIDE RED SMALL 6 (CLIP) ×6 IMPLANT
COVER PROBE W GEL 5X96 (DRAPES) ×3 IMPLANT
DRAIN PENROSE 1/2X12 LTX STRL (WOUND CARE) ×3 IMPLANT
ELECT REM PT RETURN 9FT ADLT (ELECTROSURGICAL) ×3
ELECTRODE REM PT RTRN 9FT ADLT (ELECTROSURGICAL) ×1 IMPLANT
GEL ULTRASOUND 20GR AQUASONIC (MISCELLANEOUS) ×3 IMPLANT
GLOVE BIOGEL PI IND STRL 6.5 (GLOVE) ×3 IMPLANT
GLOVE BIOGEL PI IND STRL 7.5 (GLOVE) ×1 IMPLANT
GLOVE BIOGEL PI INDICATOR 6.5 (GLOVE) ×6
GLOVE BIOGEL PI INDICATOR 7.5 (GLOVE) ×2
GLOVE ECLIPSE 6.5 STRL STRAW (GLOVE) ×3 IMPLANT
GLOVE ECLIPSE 7.5 STRL STRAW (GLOVE) ×3 IMPLANT
GLOVE SS BIOGEL STRL SZ 7 (GLOVE) ×1 IMPLANT
GLOVE SUPERSENSE BIOGEL SZ 7 (GLOVE) ×2
GLOVE SURG SS PI 7.5 STRL IVOR (GLOVE) ×3 IMPLANT
GOWN SPEC L4 XLG W/TWL (GOWN DISPOSABLE) ×3 IMPLANT
GOWN STRL REUS W/ TWL LRG LVL3 (GOWN DISPOSABLE) ×3 IMPLANT
GOWN STRL REUS W/TWL LRG LVL3 (GOWN DISPOSABLE) ×6
KIT BASIN OR (CUSTOM PROCEDURE TRAY) ×3 IMPLANT
KIT ROOM TURNOVER OR (KITS) ×3 IMPLANT
LIQUID BAND (GAUZE/BANDAGES/DRESSINGS) ×3 IMPLANT
NS IRRIG 1000ML POUR BTL (IV SOLUTION) ×3 IMPLANT
PACK CV ACCESS (CUSTOM PROCEDURE TRAY) ×3 IMPLANT
PAD ARMBOARD 7.5X6 YLW CONV (MISCELLANEOUS) ×6 IMPLANT
SUT PROLENE 6 0 BV (SUTURE) ×3 IMPLANT
SUT VIC AB 3-0 SH 27 (SUTURE) ×2
SUT VIC AB 3-0 SH 27X BRD (SUTURE) ×1 IMPLANT
UNDERPAD 30X30 INCONTINENT (UNDERPADS AND DIAPERS) ×3 IMPLANT
WATER STERILE IRR 1000ML POUR (IV SOLUTION) ×3 IMPLANT

## 2015-05-27 NOTE — Interval H&P Note (Signed)
History and Physical Interval Note:  05/27/2015 11:52 AM  Katelyn Dunlap  has presented today for surgery, with the diagnosis of Stage 4 chronic kidney disease  The various methods of treatment have been discussed with the patient and family. After consideration of risks, benefits and other options for treatment, the patient has consented to  Procedure(s):  BRACHIOCEPHALIC ARTERIOVENOUS (AV) FISTULA CREATION (Left) as a surgical intervention .  The patient's history has been reviewed, patient examined, no change in status, stable for surgery.  I have reviewed the patient's chart and labs.  Questions were answered to the patient's satisfaction.     Tinnie Gens

## 2015-05-27 NOTE — Transfer of Care (Signed)
Immediate Anesthesia Transfer of Care Note  Patient: Katelyn Dunlap  Procedure(s) Performed: Procedure(s): CREATION OF  LEFT UPPER ARM BRACHIOCEPHALIC ARTERIOVENOUS (AV) FISTULA  (Left)  Patient Location: PACU  Anesthesia Type:MAC  Level of Consciousness: awake, alert , oriented and patient cooperative  Airway & Oxygen Therapy: Patient Spontanous Breathing and Patient connected to nasal cannula oxygen  Post-op Assessment: Report given to RN, Post -op Vital signs reviewed and stable and Patient moving all extremities  Post vital signs: Reviewed and stable  Last Vitals:  Filed Vitals:   05/27/15 1111  BP: 156/57  Pulse: 85  Temp: 36.6 C  Resp: 18    Complications: No apparent anesthesia complications

## 2015-05-27 NOTE — Op Note (Signed)
OPERATIVE REPORT  Date of Surgery: 05/27/2015  Surgeon: Tinnie Gens, MD  Assistant: Nurse  Pre-op Diagnosis: Stage 4 chronic kidney disease  Post-op Diagnosis: Stage 4 chronic kidney disease  Procedure: Procedure(s): CREATION OF  LEFT UPPER ARM BRACHIOCEPHALIC ARTERIOVENOUS (AV) FISTULA   Anesthesia: MAC  EBL: Minimal  Complications: None  Procedure Details: The patient was taken operating room placed in supine position at which time satisfactory prepping and draping in routine manner was performed. The cephalic vein was imaged using B-mode ultrasound-sono site and appeared to be adequate in the upper arm but not adequate in the forearm as was suggested by the preoperative vein mapping. After infiltration forms and Xylocaine with epinephrine transverse incision was made in the antecubital area antecubital vein dissected free. It was ligated distally transected. Branches ligated with 3 and 4-0 silk ties and divided. It was a 3 mm vein in this area and would easily accept heparin saline to be flushed proximally. Brachial the brachial artery was then exposed beneath the fashion circled with Vesseloops. It had an excellent pulse and mild disease. It was encircled proximal and distally temporarily occluded opened 15 blade extended with Potts scissors. Vein was carefully measured spatulated and anastomosed inside with 6-0 Prolene. Vesseloops were then released there was next pulse and thrill in the fistula with excellent Doppler flow into the proximal upper arm. The radial and ulnar flow was not changed significantly with compression or release of the fistula. Adequate hemostasis was achieved wound was closed in layers of Vicryl subcuticular fashion with Dermabond patient taken to recovery in satisfactory condition  Tinnie Gens, MD 05/27/2015 1:39 PM

## 2015-05-27 NOTE — Anesthesia Postprocedure Evaluation (Signed)
Anesthesia Post Note  Patient: Katelyn Dunlap  Procedure(s) Performed: Procedure(s) (LRB): CREATION OF  LEFT UPPER ARM BRACHIOCEPHALIC ARTERIOVENOUS (AV) FISTULA  (Left)  Patient location during evaluation: PACU Anesthesia Type: MAC Level of consciousness: awake Pain management: pain level controlled Vital Signs Assessment: post-procedure vital signs reviewed and stable Respiratory status: spontaneous breathing Cardiovascular status: stable Postop Assessment: no signs of nausea or vomiting Anesthetic complications: no    Last Vitals:  Filed Vitals:   05/27/15 1414 05/27/15 1426  BP: 129/61 145/63  Pulse: 87 88  Temp:    Resp: 16 18    Last Pain: There were no vitals filed for this visit.               Jhamir Pickup

## 2015-05-27 NOTE — H&P (View-Only) (Signed)
HISTORY AND PHYSICAL   History of Present Illness:  Patient is a 82 y.o. year old female who presents for placement of a permanent hemodialysis access. The patient is right handed .  The patient is not currently on hemodialysis.  The cause of renal failure is thought to be secondary to hypertension and DM.  Other chronic medical problems include parkinson, hyperlipidemia managed with a statin, HTN managed with labetalol.  She does not take any anticoagulant medications.  Past Medical History  Diagnosis Date  . Asthma   . Allergic rhinoconjunctivitis   . Pulmonary hypertension ([place])   . Diastolic dysfunction   . GERD (gastroesophageal reflux disease)   . Hypertension   . Diabetes ([place])   . Hyperlipemia   . Degenerative lumbar disc   . Parkinson's disease ([place])   . H/O Graves' disease   . Chronic kidney disease   . Nephrotic syndrome   . DM (diabetes mellitus), type 2 with renal complications [facility])     Past Surgical History  Procedure Laterality Date  . Cholecystectomy    . Back surgery    . Vesicovaginal fistula closure w/ tah    . Appendectomy    . Breast biopsy       Social History Social History  Substance Use Topics  . Smoking status: Never Smoker   . Smokeless tobacco: Never Used  . Alcohol Use: No    Family History Family History  Problem Relation Age of Onset  . Heart disease Father     Allergies  Allergies  Allergen Reactions  . Levaquin [Levofloxacin] Other (See Comments)    Severe tendon inflammation  . Ace Inhibitors Cough    Cough  . Amlodipine     swelling  . Nitrofurantoin     rash  . Penicillins     rash  . Pentazocine     [place] - insomnia  . Pentazocine-Naloxone     Other reaction(s): ABDOMINAL PAIN  . Pentobarbital   . Streptomycin     Rash, insomnia  . Sulfa Antibiotics     rash     Current Outpatient Prescriptions  Medication Sig Dispense Refill  . albuterol (PROVENTIL HFA;VENTOLIN HFA) 108 (90 BASE) MCG/ACT inhaler  Inhale 1-2 puffs into the lungs every 6 (six) hours as needed for wheezing or shortness of breath. 18 g 4  . atorvastatin (LIPITOR) 80 MG tablet Take 80 mg by mouth daily.    Marland Kitchen BREO ELLIPTA 200-25 MCG/INH AEPB INHALE 1 PUFF INTO THE LUNGS DAILY 60 each 0  . calcitRIOL (ROCALTROL) 0.25 MCG capsule Take 1 capsule by mouth. Three times weekly    . carbidopa-levodopa (SINEMET IR) 25-100 MG per tablet Take 1 tablet by mouth 3 (three) times daily.    . cetirizine (ZYRTEC) 10 MG tablet Take 10 mg by mouth daily.    . cloNIDine (CATAPRES) 0.3 MG tablet Take 0.3 mg by mouth daily.    . EPOETIN ALFA IJ Inject as directed every 14 (fourteen) days.     Marland Kitchen Fe-Succ-C-Thre-B12-Des Stomach (MULTIGEN PO) Take by mouth daily.    . ferrous sulfate 325 (65 FE) MG tablet Take 325 mg by mouth every other day.    . folic acid (FOLVITE) Q000111Q MCG tablet Take 400 mcg by mouth daily.    . Furosemide (LASIX PO) Take 40 mg by mouth 2 (two) times daily at 10 am and 4 pm. And second dose if needed    . gabapentin (NEURONTIN) 300 MG capsule Take 300  mg by mouth 3 (three) times daily.    . Glucosamine-Chondroitin 250-200 MG CAPS Take by mouth 2 (two) times daily.    . hydrALAZINE (APRESOLINE) 100 MG tablet Take 100 mg by mouth 3 (three) times daily.    Marland Kitchen HYDROcodone-acetaminophen (NORCO/VICODIN) 5-325 MG per tablet Take 1 tablet by mouth 2 (two) times daily as needed.    . insulin aspart (NOVOLOG FLEXPEN) 100 UNIT/ML FlexPen Inject 20 Units into the skin 3 (three) times daily with meals.     . Insulin Detemir (LEVEMIR FLEXPEN) 100 UNIT/ML Pen Inject 36 Units into the skin at bedtime.     . isosorbide mononitrate (IMDUR) 120 MG 24 hr tablet Take 120 mg by mouth daily.    Marland Kitchen labetalol (NORMODYNE) 300 MG tablet Take 300 mg by mouth 2 (two) times daily.    Marland Kitchen levothyroxine (SYNTHROID, LEVOTHROID) 200 MCG tablet Take 200 mcg by mouth daily before breakfast.    . methocarbamol (ROBAXIN) 750 MG tablet Take 750 mg by mouth. Take 1 tablet  two times a day    . Methylcellulose, Laxative, (FIBER THERAPY PO) Take by mouth 2 (two) times daily.    . metolazone (ZAROXOLYN) 5 MG tablet Take 5 mg by mouth every other day.    . mometasone (NASONEX) 50 MCG/ACT nasal spray Place 2 sprays into the nose daily.    . Multiple Vitamin (MULTIVITAMIN) tablet Take 1 tablet by mouth daily.    . multivitamin-lutein (OCUVITE-LUTEIN) CAPS capsule Take 1 capsule by mouth daily.    Marland Kitchen omeprazole (PRILOSEC) 20 MG capsule Take 20 mg by mouth daily as needed.     Marland Kitchen rOPINIRole (REQUIP) 2 MG tablet Take 2 mg by mouth 3 (three) times daily.    . valsartan (DIOVAN) 160 MG tablet Take 160 mg by mouth daily.    Marland Kitchen acetaminophen (TYLENOL) 650 MG CR tablet Take 650 mg by mouth every 8 (eight) hours as needed for pain. Reported on 05/02/2015    . aspirin 81 MG tablet Take 81 mg by mouth daily. Reported on 05/02/2015    . cyclobenzaprine (FLEXERIL) 5 MG tablet Take 10 mg by mouth 2 (two) times daily. Reported on 05/02/2015    . tiZANidine (ZANAFLEX) 4 MG capsule Take 4 mg by mouth 3 (three) times daily. Reported on 05/02/2015     No current facility-administered medications for this visit.    ROS:   General:  No weight loss, Fever, chills  HEENT: No recent headaches, no nasal bleeding, no visual changes, no sore throat  Neurologic: No dizziness, blackouts, seizures. No recent symptoms of stroke or mini- stroke. No recent episodes of slurred speech, or temporary blindness.  Cardiac: No recent episodes of chest pain/pressure, no shortness of breath at rest.  No shortness of breath with exertion.  Denies history of atrial fibrillation or irregular heartbeat  Vascular: No history of rest pain in feet.  No history of claudication.  No history of non-healing ulcer, No history of DVT   Pulmonary: No home oxygen, no productive cough, no hemoptysis,  No asthma or wheezing  Musculoskeletal:  [ ]  Arthritis, [ ]  Low back pain,  [x ] Joint pain  Hematologic:No history of  hypercoagulable state.  No history of easy bleeding.  No history of anemia  Gastrointestinal: No hematochezia or melena,  No gastroesophageal reflux, no trouble swallowing  Urinary: [x ] chronic Kidney disease, [ ]  on HD - [ ]  MWF or [ ]  TTHS, [ ]  Burning with urination, [ ]  Frequent  urination, [ ]  Difficulty urinating;   Skin: No rashes  Psychological: No history of anxiety,  No history of depression   Physical Examination  Filed Vitals:   05/02/15 1259  BP: 119/61  Pulse: 82  Temp: 97.6 F (36.4 C)  TempSrc: Oral  Resp: 18  Height: 5' (1.524 m)  Weight: 194 lb (87.998 kg)  SpO2: 95%    Body mass index is 37.89 kg/(m^2).  General:  Alert and oriented, no acute distress HEENT: Normal Neck: No bruit or JVD Pulmonary: Clear to auscultation bilaterally Cardiac: Regular Rate and Rhythm without murmur Gastrointestinal: Soft, non-tender, non-distended, no mass, no scars Skin: No rash Extremity Pulses:  2+ radial, brachial pulses bilaterally Musculoskeletal: No deformity or edema  Neurologic: Upper and lower extremity motor 5/5 and symmetric  DATA:  She has an acceptable cephalic vein on the left UE.  0.28-0.34 cm diameter    ASSESSMENT:  CKD stage IV  PLAN: We will plan left brachial cephalic av fistula creation by Dr. Trula Slade on 05/27/2015.  We discussed the procedure and plan with her and her husband today and they are in agreement to proceed.    Theda Sers EMMA [facility] PA-C [facility] Office: [phone]  The patient was seen in conjunction with Dr. Trula Slade today.   I agree with the above.  This is an 82 year old female with stage V renal insufficiency.  She is here today for access planning.  I have reviewed her duplex.  She is a candidate for a left brachiocephalic fistula.  I discussed the risks and benefits of the procedure with the patient including the risk of non-maturity, the need for future interventions, and the risk of  steal syndrome.  All of her questions were answered.  Her procedure has been scheduled for Friday, April 21.  Annamarie Major

## 2015-05-27 NOTE — Anesthesia Preprocedure Evaluation (Addendum)
Anesthesia Evaluation  Patient identified by MRN, date of birth, ID band Patient awake    Reviewed: Allergy & Precautions, NPO status , Patient's Chart, lab work & pertinent test results  History of Anesthesia Complications Negative for: history of anesthetic complications  Airway Mallampati: IV  TM Distance: >3 FB Neck ROM: Full    Dental  (+) Teeth Intact   Pulmonary COPD,  COPD inhaler and oxygen dependent,    breath sounds clear to auscultation       Cardiovascular hypertension, Pt. on medications and Pt. on home beta blockers (-) angina+CHF  (-) CAD and (-) DOE  Rhythm:Regular     Neuro/Psych  Neuromuscular disease    GI/Hepatic GERD  Controlled,  Endo/Other  diabetes, Type 2, Insulin DependentHypothyroidism Morbid obesity  Renal/GU Renal InsufficiencyRenal disease     Musculoskeletal  (+) Arthritis ,   Abdominal   Peds  Hematology   Anesthesia Other Findings   Reproductive/Obstetrics                            Anesthesia Physical Anesthesia Plan  ASA: III  Anesthesia Plan: MAC   Post-op Pain Management:    Induction: Intravenous  Airway Management Planned: Nasal Cannula, Natural Airway and Simple Face Mask  Additional Equipment: None  Intra-op Plan:   Post-operative Plan:   Informed Consent: I have reviewed the patients History and Physical, chart, labs and discussed the procedure including the risks, benefits and alternatives for the proposed anesthesia with the patient or authorized representative who has indicated his/her understanding and acceptance.   Dental advisory given  Plan Discussed with: CRNA and Surgeon  Anesthesia Plan Comments:         Anesthesia Quick Evaluation

## 2015-05-30 ENCOUNTER — Encounter (HOSPITAL_COMMUNITY): Payer: Self-pay | Admitting: Vascular Surgery

## 2015-05-31 ENCOUNTER — Telehealth: Payer: Self-pay | Admitting: Vascular Surgery

## 2015-05-31 NOTE — Telephone Encounter (Signed)
-----   Message from Mena Goes, RN sent at 05/27/2015 12:47 PM EDT ----- Regarding: schedule   ----- Message -----    From: Ulyses Amor, PA-C    Sent: 05/27/2015  12:34 PM      To: [facility] Charge Pool  AV fistula creation f/u with Dr. Kellie Simmering in 4-5 weeks with fistula duplex

## 2015-05-31 NOTE — Telephone Encounter (Signed)
sched appt 6/5 lab 11:30 and md 12:45. Ph# is not working. Mailed appt letter through regular mail to inform pt of appt.

## 2015-07-05 ENCOUNTER — Encounter: Payer: Self-pay | Admitting: Vascular Surgery

## 2015-07-11 ENCOUNTER — Encounter: Payer: Self-pay | Admitting: Vascular Surgery

## 2015-07-11 ENCOUNTER — Ambulatory Visit (HOSPITAL_COMMUNITY)
Admission: RE | Admit: 2015-07-11 | Discharge: 2015-07-11 | Disposition: A | Discharge status: Home / Self Care | Payer: Medicare Other | Source: Ambulatory Visit | Attending: Vascular Surgery | Admitting: Vascular Surgery

## 2015-07-11 ENCOUNTER — Ambulatory Visit (INDEPENDENT_AMBULATORY_CARE_PROVIDER_SITE_OTHER): Payer: Self-pay | Admitting: Vascular Surgery

## 2015-07-11 DIAGNOSIS — Z992 Dependence on renal dialysis: Secondary | ICD-10-CM

## 2015-07-11 DIAGNOSIS — E785 Hyperlipidemia, unspecified: Secondary | ICD-10-CM | POA: Diagnosis not present

## 2015-07-11 DIAGNOSIS — K219 Gastro-esophageal reflux disease without esophagitis: Secondary | ICD-10-CM | POA: Insufficient documentation

## 2015-07-11 DIAGNOSIS — Z48812 Encounter for surgical aftercare following surgery on the circulatory system: Secondary | ICD-10-CM

## 2015-07-11 DIAGNOSIS — N186 End stage renal disease: Secondary | ICD-10-CM | POA: Diagnosis present

## 2015-07-11 DIAGNOSIS — I509 Heart failure, unspecified: Secondary | ICD-10-CM | POA: Diagnosis not present

## 2015-07-11 DIAGNOSIS — Z4931 Encounter for adequacy testing for hemodialysis: Secondary | ICD-10-CM | POA: Diagnosis not present

## 2015-07-11 DIAGNOSIS — E1122 Type 2 diabetes mellitus with diabetic chronic kidney disease: Secondary | ICD-10-CM | POA: Insufficient documentation

## 2015-07-11 DIAGNOSIS — I132 Hypertensive heart and chronic kidney disease with heart failure and with stage 5 chronic kidney disease, or end stage renal disease: Secondary | ICD-10-CM | POA: Insufficient documentation

## 2015-07-11 DIAGNOSIS — N184 Chronic kidney disease, stage 4 (severe): Secondary | ICD-10-CM

## 2015-07-11 DIAGNOSIS — I10 Essential (primary) hypertension: Secondary | ICD-10-CM | POA: Insufficient documentation

## 2015-07-11 NOTE — Progress Notes (Signed)
Filed Vitals:   07/11/15 1240 07/11/15 1245  BP: 187/70 190/76  Pulse: 79   Temp: 97.2 F (36.2 C)   TempSrc: Oral   Resp: 20   Height: 5' (1.524 m)   Weight: 163 lb 8 oz (74.163 kg)    Pt. reported she did not take her BP medication this morning due to upset stomach.

## 2015-07-11 NOTE — Progress Notes (Signed)
Subjective:     Patient ID: Katelyn Dunlap, female   DOB: [birth year], 82 y.o.   MRN: [id number]  HPI this 82 year old female returns for initial follow-up regarding her left brachial-cephalic AV fistula I created on 05/27/2015. The patient has chronic kidney disease stage IV but has not been on hemodialysis in the past. She is followed by Dr. Edrick Oh. She denies any pain or numbness in the left hand and has no specific complaints. She states she has lost a lot of weight with resolution of the severe edema in her lower extremities.   Review of Systems     Objective:   Physical Exam BP 190/76 mmHg  Pulse 79  Temp(Src) 97.2 F (36.2 C) (Oral)  Resp 20  Ht 5' (1.524 m)  Wt 163 lb 8 oz (74.163 kg)  BMI 31.93 kg/m2 .     Gen. alert and oriented 3 in no apparent distress Left upper extremity with well-healed incision and antecubital space Excellent pulse and thrill palpable in cephalic vein in the upper arm with well-perfused left hand 1-2+ radial pulse palpable with calcified radial artery  Today I ordered a duplex exam of the fistula in the left forearm which revealed satisfactory flow with a satisfactory diameter of the vein varying between 0.27 and 0.59 cm Assessment:      nicely functioning left brachial-cephalic AV fistula created 05/27/2015 in patient with chronic kidney disease stage IV     Plan:    patient will return to see Dr. Edrick Oh in the next few weeks and return to see [place] on a when necessary basis Fistula could be utilized if necessary 08/26/2015

## 2015-07-27 DIAGNOSIS — N184 Chronic kidney disease, stage 4 (severe): Secondary | ICD-10-CM | POA: Diagnosis not present

## 2015-07-27 DIAGNOSIS — R410 Disorientation, unspecified: Secondary | ICD-10-CM | POA: Diagnosis not present

## 2015-07-27 DIAGNOSIS — N390 Urinary tract infection, site not specified: Secondary | ICD-10-CM

## 2015-07-27 DIAGNOSIS — E860 Dehydration: Secondary | ICD-10-CM

## 2015-07-28 DIAGNOSIS — E860 Dehydration: Secondary | ICD-10-CM

## 2015-07-28 DIAGNOSIS — N184 Chronic kidney disease, stage 4 (severe): Secondary | ICD-10-CM

## 2015-07-28 DIAGNOSIS — R634 Abnormal weight loss: Secondary | ICD-10-CM

## 2015-07-28 DIAGNOSIS — N390 Urinary tract infection, site not specified: Secondary | ICD-10-CM

## 2015-07-28 DIAGNOSIS — R410 Disorientation, unspecified: Secondary | ICD-10-CM

## 2015-07-29 DIAGNOSIS — R634 Abnormal weight loss: Secondary | ICD-10-CM

## 2015-07-29 DIAGNOSIS — N390 Urinary tract infection, site not specified: Secondary | ICD-10-CM | POA: Diagnosis not present

## 2015-07-29 DIAGNOSIS — R410 Disorientation, unspecified: Secondary | ICD-10-CM | POA: Diagnosis not present

## 2015-07-29 DIAGNOSIS — N184 Chronic kidney disease, stage 4 (severe): Secondary | ICD-10-CM | POA: Diagnosis not present

## 2015-07-29 DIAGNOSIS — E860 Dehydration: Secondary | ICD-10-CM

## 2015-07-30 DIAGNOSIS — E860 Dehydration: Secondary | ICD-10-CM | POA: Diagnosis not present

## 2015-07-30 DIAGNOSIS — N390 Urinary tract infection, site not specified: Secondary | ICD-10-CM | POA: Diagnosis not present

## 2015-07-30 DIAGNOSIS — R410 Disorientation, unspecified: Secondary | ICD-10-CM | POA: Diagnosis not present

## 2015-07-30 DIAGNOSIS — N184 Chronic kidney disease, stage 4 (severe): Secondary | ICD-10-CM | POA: Diagnosis not present

## 2015-07-31 DIAGNOSIS — N390 Urinary tract infection, site not specified: Secondary | ICD-10-CM | POA: Diagnosis not present

## 2015-07-31 DIAGNOSIS — R410 Disorientation, unspecified: Secondary | ICD-10-CM | POA: Diagnosis not present

## 2015-07-31 DIAGNOSIS — N184 Chronic kidney disease, stage 4 (severe): Secondary | ICD-10-CM | POA: Diagnosis not present

## 2015-07-31 DIAGNOSIS — E860 Dehydration: Secondary | ICD-10-CM | POA: Diagnosis not present

## 2015-08-01 DIAGNOSIS — E860 Dehydration: Secondary | ICD-10-CM | POA: Diagnosis not present

## 2015-08-01 DIAGNOSIS — R410 Disorientation, unspecified: Secondary | ICD-10-CM | POA: Diagnosis not present

## 2015-08-01 DIAGNOSIS — N390 Urinary tract infection, site not specified: Secondary | ICD-10-CM | POA: Diagnosis not present

## 2015-08-01 DIAGNOSIS — N184 Chronic kidney disease, stage 4 (severe): Secondary | ICD-10-CM | POA: Diagnosis not present

## 2015-08-02 DIAGNOSIS — N390 Urinary tract infection, site not specified: Secondary | ICD-10-CM | POA: Diagnosis not present

## 2015-08-02 DIAGNOSIS — N184 Chronic kidney disease, stage 4 (severe): Secondary | ICD-10-CM | POA: Diagnosis not present

## 2015-08-02 DIAGNOSIS — R410 Disorientation, unspecified: Secondary | ICD-10-CM | POA: Diagnosis not present

## 2015-08-02 DIAGNOSIS — E860 Dehydration: Secondary | ICD-10-CM | POA: Diagnosis not present

## 2015-08-03 DIAGNOSIS — R410 Disorientation, unspecified: Secondary | ICD-10-CM | POA: Diagnosis not present

## 2015-08-03 DIAGNOSIS — E860 Dehydration: Secondary | ICD-10-CM | POA: Diagnosis not present

## 2015-08-03 DIAGNOSIS — N390 Urinary tract infection, site not specified: Secondary | ICD-10-CM | POA: Diagnosis not present

## 2015-08-03 DIAGNOSIS — N184 Chronic kidney disease, stage 4 (severe): Secondary | ICD-10-CM | POA: Diagnosis not present

## 2015-12-07 DEATH — deceased

## 2016-06-09 IMAGING — DX DG CHEST 2V
2 series · 2 of 2 positions shown · non-contrast
Comparison: None.

CLINICAL DATA: Cough and shortness of breath for 1 week

EXAM:
CHEST  2 VIEW

[chest lat]
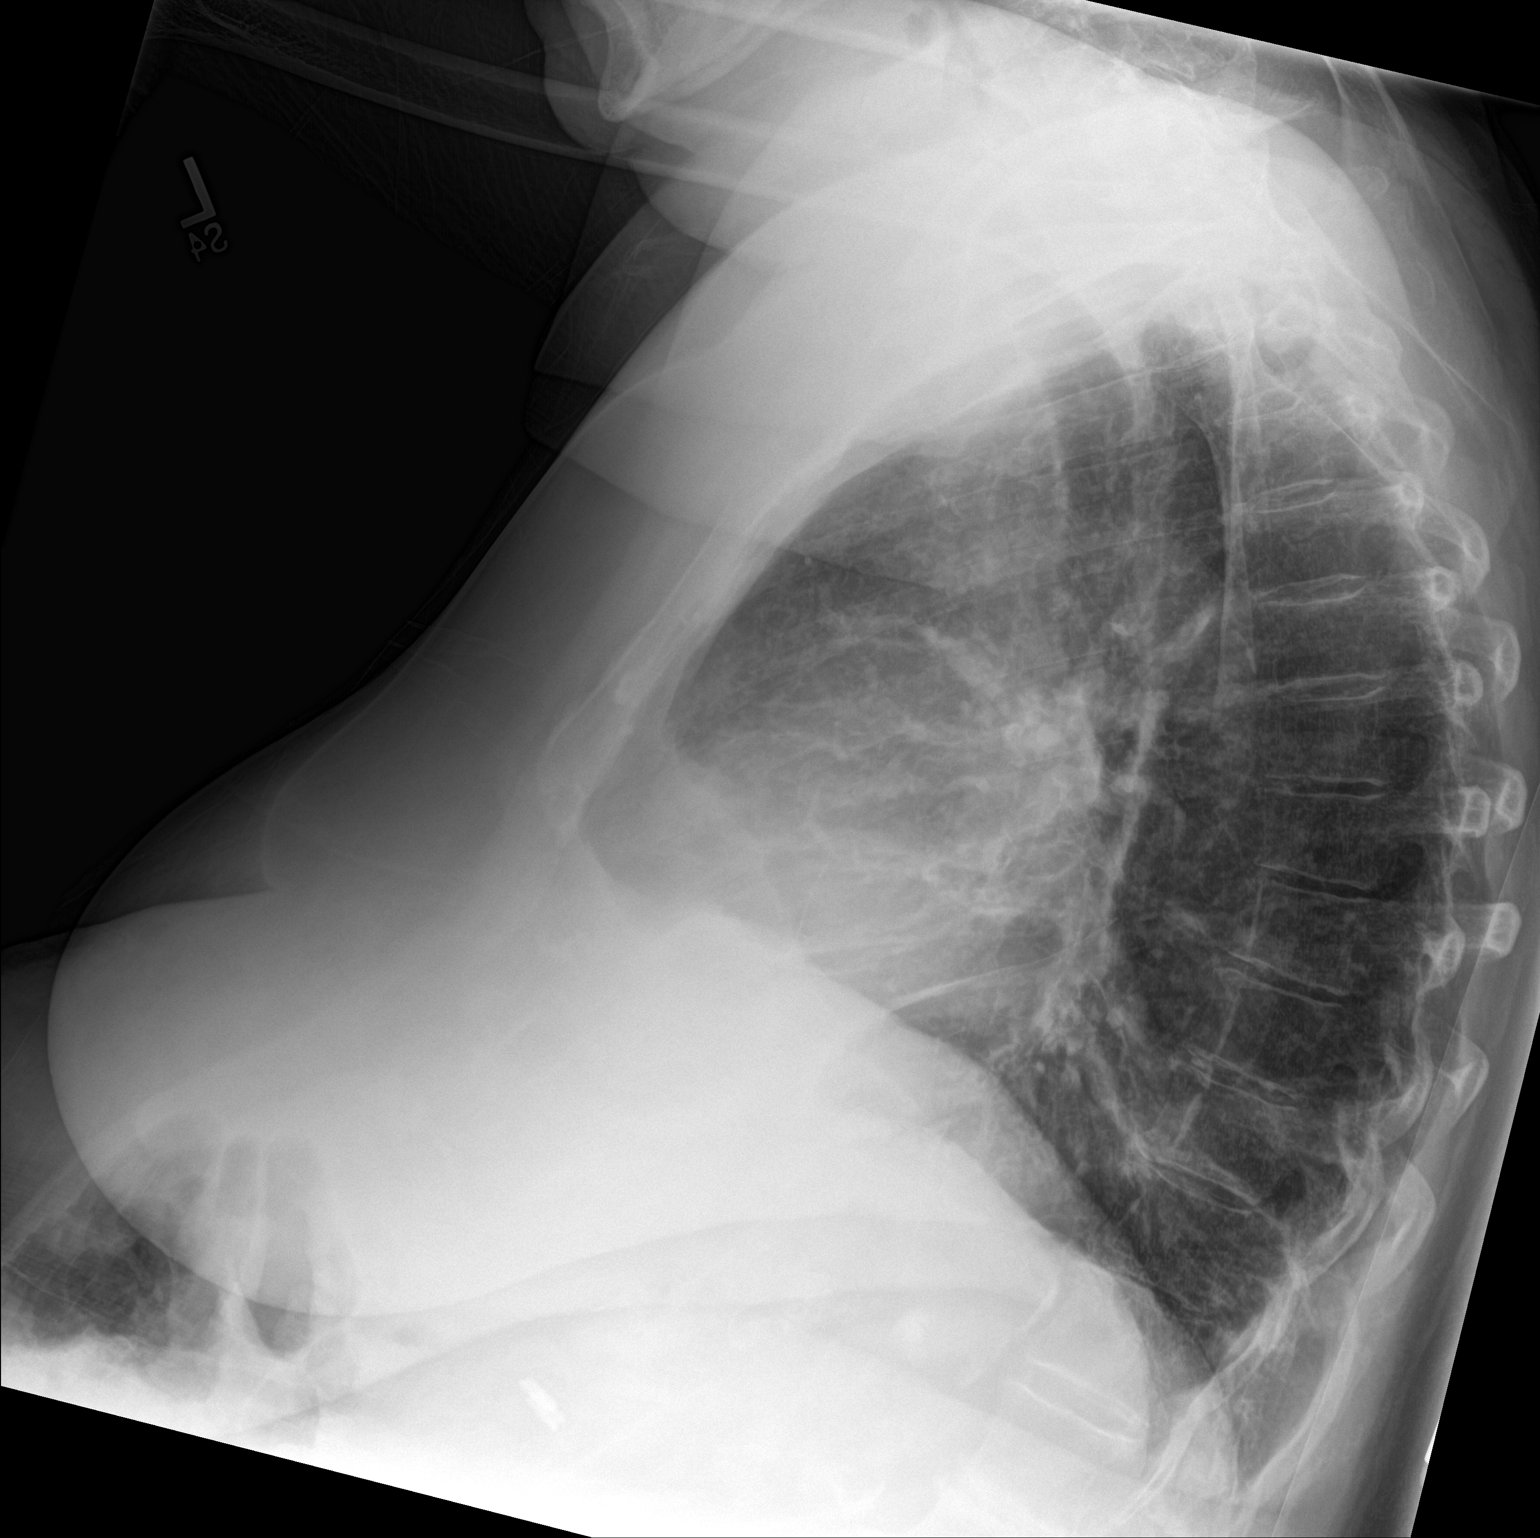

[chest ap]
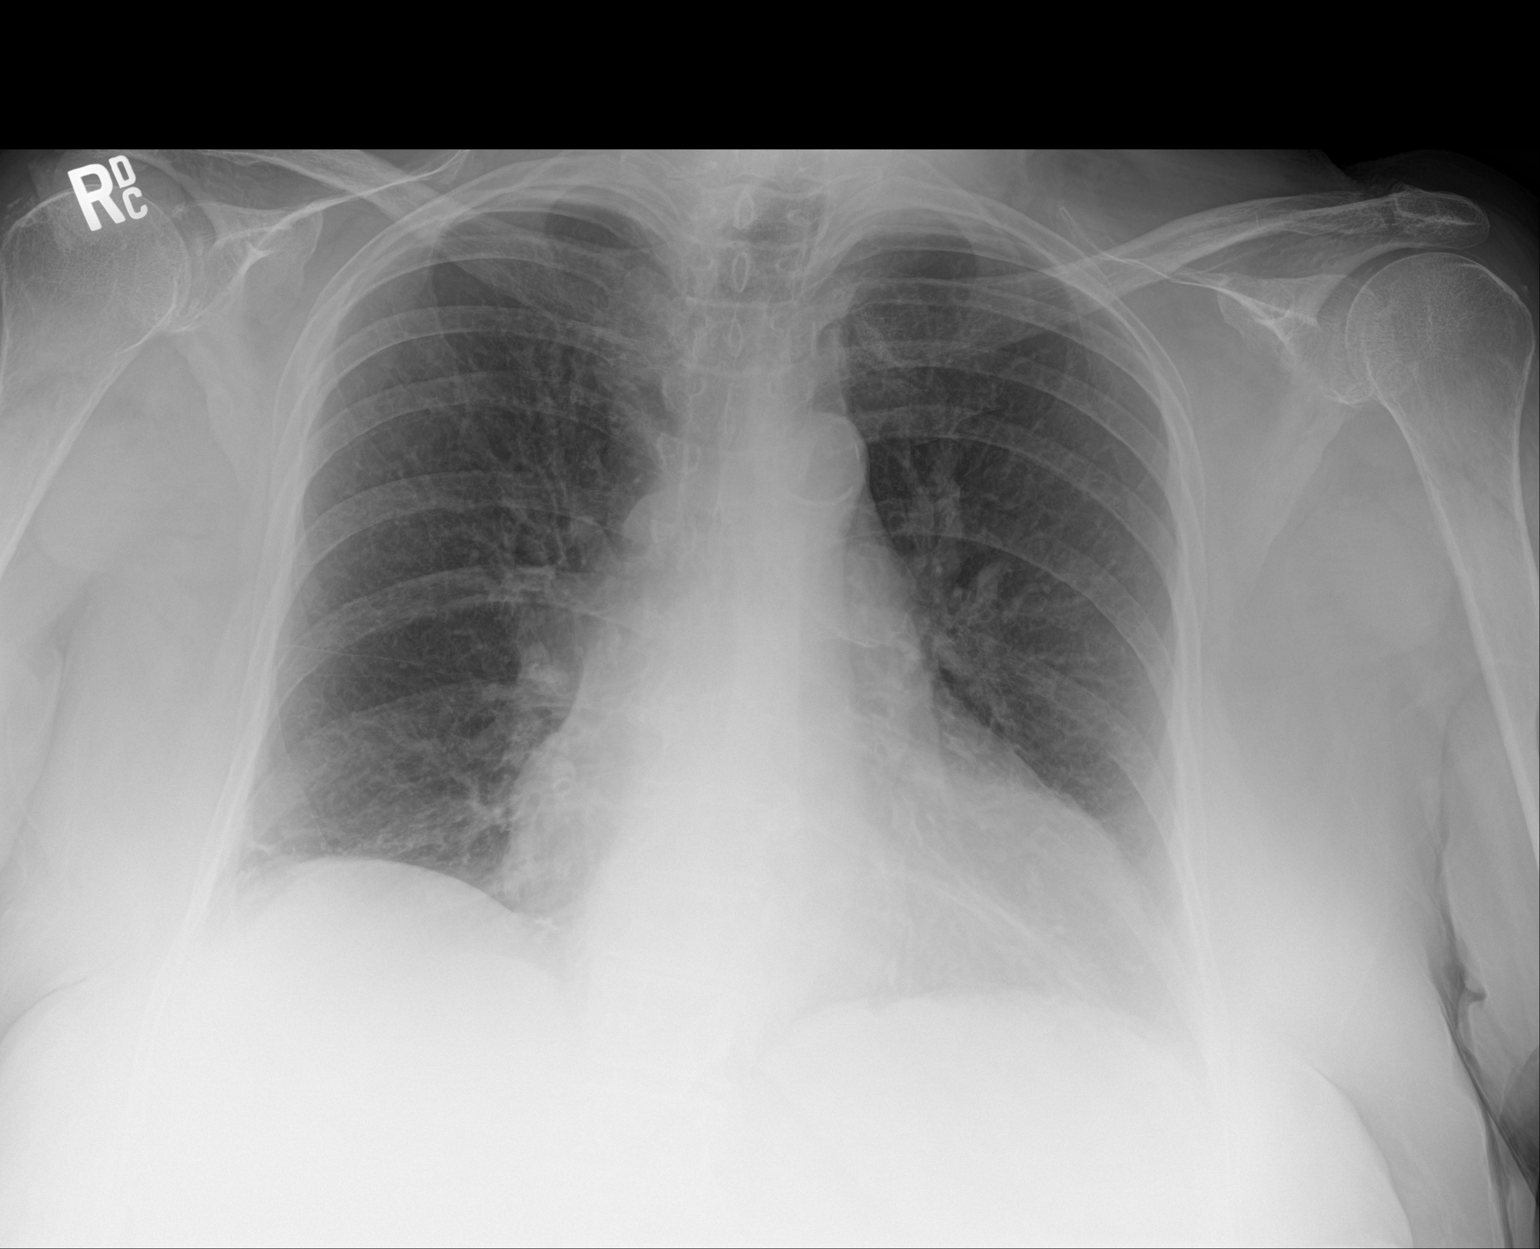

[2 of 2 positions shown; findings below may reference images not displayed]

FINDINGS: Cardiac shadow is mildly enlarged. No focal infiltrate is seen. Mild
central vascular congestion is noted without significant edema. No
effusion is seen. No bony abnormality is noted.
IMPRESSION: Mild central vascular congestion without focal infiltrate or edema.
